# Patient Record
Sex: Female | Born: 1950
Health system: Southern US, Community
[De-identification: ages and names within clinical notes are randomized; demographics above are authoritative.]

## PROBLEM LIST (undated history)

## (undated) DIAGNOSIS — M8580 Other specified disorders of bone density and structure, unspecified site: Secondary | ICD-10-CM

## (undated) DIAGNOSIS — G4730 Sleep apnea, unspecified: Secondary | ICD-10-CM

## (undated) DIAGNOSIS — Z9221 Personal history of antineoplastic chemotherapy: Secondary | ICD-10-CM

## (undated) DIAGNOSIS — Z853 Personal history of malignant neoplasm of breast: Principal | ICD-10-CM

## (undated) DIAGNOSIS — Z923 Personal history of irradiation: Secondary | ICD-10-CM

## (undated) HISTORY — DX: Personal history of antineoplastic chemotherapy: Z92.21

## (undated) HISTORY — DX: Personal history of irradiation: Z92.3

## (undated) HISTORY — DX: Other specified disorders of bone density and structure, unspecified site: M85.80

## (undated) HISTORY — DX: Sleep apnea, unspecified: G47.30

## (undated) HISTORY — PX: COSMETIC SURGERY: SHX468

## (undated) HISTORY — DX: Personal history of malignant neoplasm of breast: Z85.3

---

## 1997-03-24 HISTORY — PX: ACHILLES TENDON REPAIR: SUR1153

## 1997-09-06 ENCOUNTER — Ambulatory Visit (HOSPITAL_COMMUNITY): Admission: RE | Admit: 1997-09-06 | Discharge: 1997-09-07 | Payer: Self-pay | Admitting: *Deleted

## 1997-09-23 ENCOUNTER — Emergency Department (HOSPITAL_COMMUNITY): Admission: EM | Admit: 1997-09-23 | Discharge: 1997-09-23 | Payer: Self-pay | Admitting: Emergency Medicine

## 1998-02-27 ENCOUNTER — Other Ambulatory Visit: Admission: RE | Admit: 1998-02-27 | Discharge: 1998-02-27 | Payer: Self-pay | Admitting: Obstetrics and Gynecology

## 1999-03-08 ENCOUNTER — Other Ambulatory Visit: Admission: RE | Admit: 1999-03-08 | Discharge: 1999-03-08 | Payer: Self-pay | Admitting: Obstetrics and Gynecology

## 2000-04-14 ENCOUNTER — Other Ambulatory Visit: Admission: RE | Admit: 2000-04-14 | Discharge: 2000-04-14 | Payer: Self-pay | Admitting: Obstetrics and Gynecology

## 2001-04-19 ENCOUNTER — Encounter: Admission: RE | Admit: 2001-04-19 | Discharge: 2001-04-19 | Payer: Self-pay | Admitting: Obstetrics and Gynecology

## 2001-04-19 ENCOUNTER — Other Ambulatory Visit: Admission: RE | Admit: 2001-04-19 | Discharge: 2001-04-19 | Payer: Self-pay | Admitting: Radiology

## 2001-04-19 ENCOUNTER — Encounter (INDEPENDENT_AMBULATORY_CARE_PROVIDER_SITE_OTHER): Payer: Self-pay | Admitting: *Deleted

## 2001-04-19 ENCOUNTER — Encounter: Payer: Self-pay | Admitting: Obstetrics and Gynecology

## 2001-04-19 HISTORY — PX: BREAST BIOPSY: SHX20

## 2001-04-23 ENCOUNTER — Encounter: Admission: RE | Admit: 2001-04-23 | Discharge: 2001-04-23 | Payer: Self-pay | Admitting: Surgery

## 2001-04-23 ENCOUNTER — Encounter: Payer: Self-pay | Admitting: Surgery

## 2001-04-26 ENCOUNTER — Ambulatory Visit: Admission: RE | Admit: 2001-04-26 | Discharge: 2001-07-25 | Payer: Self-pay | Admitting: Radiation Oncology

## 2001-04-27 ENCOUNTER — Ambulatory Visit (HOSPITAL_BASED_OUTPATIENT_CLINIC_OR_DEPARTMENT_OTHER): Admission: RE | Admit: 2001-04-27 | Discharge: 2001-04-27 | Payer: Self-pay | Admitting: Surgery

## 2001-04-27 ENCOUNTER — Encounter (INDEPENDENT_AMBULATORY_CARE_PROVIDER_SITE_OTHER): Payer: Self-pay | Admitting: Specialist

## 2001-04-27 HISTORY — PX: BREAST LUMPECTOMY: SHX2

## 2001-05-12 ENCOUNTER — Other Ambulatory Visit: Admission: RE | Admit: 2001-05-12 | Discharge: 2001-05-12 | Payer: Self-pay | Admitting: Obstetrics and Gynecology

## 2001-05-22 DIAGNOSIS — Z9221 Personal history of antineoplastic chemotherapy: Secondary | ICD-10-CM

## 2001-05-22 HISTORY — DX: Personal history of antineoplastic chemotherapy: Z92.21

## 2001-05-28 ENCOUNTER — Encounter: Payer: Self-pay | Admitting: Oncology

## 2001-05-28 ENCOUNTER — Ambulatory Visit (HOSPITAL_COMMUNITY): Admission: RE | Admit: 2001-05-28 | Discharge: 2001-05-28 | Payer: Self-pay | Admitting: Oncology

## 2001-07-22 DIAGNOSIS — Z923 Personal history of irradiation: Secondary | ICD-10-CM

## 2001-07-22 HISTORY — DX: Personal history of irradiation: Z92.3

## 2001-08-04 ENCOUNTER — Ambulatory Visit: Admission: RE | Admit: 2001-08-04 | Discharge: 2001-11-02 | Payer: Self-pay | Admitting: Radiation Oncology

## 2002-01-24 ENCOUNTER — Encounter: Admission: RE | Admit: 2002-01-24 | Discharge: 2002-01-24 | Payer: Self-pay | Admitting: Oncology

## 2002-01-24 ENCOUNTER — Encounter: Payer: Self-pay | Admitting: Oncology

## 2002-03-24 HISTORY — PX: DILATION AND CURETTAGE OF UTERUS: SHX78

## 2002-06-29 ENCOUNTER — Other Ambulatory Visit: Admission: RE | Admit: 2002-06-29 | Discharge: 2002-06-29 | Payer: Self-pay | Admitting: Obstetrics and Gynecology

## 2002-09-06 ENCOUNTER — Encounter: Payer: Self-pay | Admitting: Oncology

## 2002-09-06 ENCOUNTER — Encounter (HOSPITAL_COMMUNITY): Admission: RE | Admit: 2002-09-06 | Discharge: 2002-12-05 | Payer: Self-pay | Admitting: Oncology

## 2002-09-07 ENCOUNTER — Encounter: Payer: Self-pay | Admitting: Oncology

## 2002-11-10 ENCOUNTER — Ambulatory Visit (HOSPITAL_BASED_OUTPATIENT_CLINIC_OR_DEPARTMENT_OTHER): Admission: RE | Admit: 2002-11-10 | Discharge: 2002-11-10 | Payer: Self-pay | Admitting: Obstetrics and Gynecology

## 2002-11-10 ENCOUNTER — Encounter (INDEPENDENT_AMBULATORY_CARE_PROVIDER_SITE_OTHER): Payer: Self-pay | Admitting: *Deleted

## 2003-04-20 ENCOUNTER — Encounter: Admission: RE | Admit: 2003-04-20 | Discharge: 2003-04-20 | Payer: Self-pay | Admitting: Infectious Diseases

## 2003-04-25 ENCOUNTER — Encounter: Admission: RE | Admit: 2003-04-25 | Discharge: 2003-04-25 | Payer: Self-pay | Admitting: Infectious Diseases

## 2003-08-28 ENCOUNTER — Encounter: Admission: RE | Admit: 2003-08-28 | Discharge: 2003-08-28 | Payer: Self-pay | Admitting: Oncology

## 2004-04-19 ENCOUNTER — Ambulatory Visit: Payer: Self-pay | Admitting: Oncology

## 2004-09-20 ENCOUNTER — Encounter: Admission: RE | Admit: 2004-09-20 | Discharge: 2004-09-20 | Payer: Self-pay | Admitting: Oncology

## 2004-10-01 ENCOUNTER — Other Ambulatory Visit: Admission: RE | Admit: 2004-10-01 | Discharge: 2004-10-01 | Payer: Self-pay | Admitting: Obstetrics and Gynecology

## 2004-10-08 ENCOUNTER — Encounter: Admission: RE | Admit: 2004-10-08 | Discharge: 2004-10-08 | Payer: Self-pay | Admitting: Oncology

## 2004-11-11 ENCOUNTER — Ambulatory Visit: Payer: Self-pay | Admitting: Oncology

## 2005-05-12 ENCOUNTER — Ambulatory Visit: Payer: Self-pay | Admitting: Oncology

## 2005-07-04 ENCOUNTER — Ambulatory Visit: Payer: Self-pay | Admitting: Oncology

## 2005-08-20 ENCOUNTER — Ambulatory Visit: Payer: Self-pay | Admitting: Oncology

## 2005-10-13 ENCOUNTER — Encounter: Admission: RE | Admit: 2005-10-13 | Discharge: 2005-10-13 | Payer: Self-pay | Admitting: Obstetrics and Gynecology

## 2005-11-21 ENCOUNTER — Ambulatory Visit: Payer: Self-pay | Admitting: Oncology

## 2005-11-26 LAB — CBC WITH DIFFERENTIAL/PLATELET
BASO%: 0.3 % (ref 0.0–2.0)
EOS%: 2.7 % (ref 0.0–7.0)
LYMPH%: 43.8 % (ref 14.0–48.0)
MCH: 30.8 pg (ref 26.0–34.0)
MCHC: 34.2 g/dL (ref 32.0–36.0)
MONO#: 0.2 10*3/uL (ref 0.1–0.9)
MONO%: 6.6 % (ref 0.0–13.0)
Platelets: 189 10*3/uL (ref 145–400)
RBC: 4.58 10*6/uL (ref 3.70–5.32)
WBC: 3.5 10*3/uL — ABNORMAL LOW (ref 3.9–10.0)

## 2005-11-26 LAB — COMPREHENSIVE METABOLIC PANEL
ALT: 21 U/L (ref 0–40)
AST: 18 U/L (ref 0–37)
Alkaline Phosphatase: 35 U/L — ABNORMAL LOW (ref 39–117)
CO2: 25 mEq/L (ref 19–32)
Creatinine, Ser: 0.74 mg/dL (ref 0.40–1.20)
Sodium: 139 mEq/L (ref 135–145)
Total Bilirubin: 0.4 mg/dL (ref 0.3–1.2)
Total Protein: 6.8 g/dL (ref 6.0–8.3)

## 2005-12-24 ENCOUNTER — Encounter: Admission: RE | Admit: 2005-12-24 | Discharge: 2005-12-24 | Payer: Self-pay | Admitting: Oncology

## 2006-05-20 ENCOUNTER — Ambulatory Visit: Payer: Self-pay | Admitting: Oncology

## 2006-06-01 LAB — CBC WITH DIFFERENTIAL/PLATELET
BASO%: 0.6 % (ref 0.0–2.0)
Basophils Absolute: 0 10*3/uL (ref 0.0–0.1)
EOS%: 2.1 % (ref 0.0–7.0)
HCT: 41.4 % (ref 34.8–46.6)
HGB: 14.4 g/dL (ref 11.6–15.9)
LYMPH%: 49.1 % — ABNORMAL HIGH (ref 14.0–48.0)
MCH: 31 pg (ref 26.0–34.0)
MCHC: 34.8 g/dL (ref 32.0–36.0)
NEUT%: 41.5 % (ref 39.6–76.8)
Platelets: 182 10*3/uL (ref 145–400)

## 2006-06-01 LAB — COMPREHENSIVE METABOLIC PANEL
ALT: 14 U/L (ref 0–35)
BUN: 15 mg/dL (ref 6–23)
CO2: 25 mEq/L (ref 19–32)
Calcium: 9.4 mg/dL (ref 8.4–10.5)
Chloride: 104 mEq/L (ref 96–112)
Creatinine, Ser: 0.86 mg/dL (ref 0.40–1.20)

## 2006-06-01 LAB — CANCER ANTIGEN 27.29: CA 27.29: 9 U/mL (ref 0–39)

## 2006-12-07 ENCOUNTER — Encounter: Admission: RE | Admit: 2006-12-07 | Discharge: 2006-12-07 | Payer: Self-pay | Admitting: Oncology

## 2007-02-22 ENCOUNTER — Other Ambulatory Visit: Admission: RE | Admit: 2007-02-22 | Discharge: 2007-02-22 | Payer: Self-pay | Admitting: Obstetrics and Gynecology

## 2007-05-20 ENCOUNTER — Ambulatory Visit: Payer: Self-pay | Admitting: Oncology

## 2007-05-27 LAB — CBC WITH DIFFERENTIAL/PLATELET
BASO%: 1.4 % (ref 0.0–2.0)
Basophils Absolute: 0 10*3/uL (ref 0.0–0.1)
EOS%: 2.8 % (ref 0.0–7.0)
MCH: 31.7 pg (ref 26.0–34.0)
MCHC: 35.6 g/dL (ref 32.0–36.0)
MCV: 89.2 fL (ref 81.0–101.0)
MONO%: 7.2 % (ref 0.0–13.0)
RBC: 4.64 10*6/uL (ref 3.70–5.32)
RDW: 12.2 % (ref 11.3–14.5)

## 2007-05-27 LAB — CANCER ANTIGEN 27.29: CA 27.29: 10 U/mL (ref 0–39)

## 2007-05-27 LAB — COMPREHENSIVE METABOLIC PANEL
ALT: 20 U/L (ref 0–35)
AST: 18 U/L (ref 0–37)
CO2: 26 mEq/L (ref 19–32)
Chloride: 103 mEq/L (ref 96–112)
Sodium: 140 mEq/L (ref 135–145)
Total Bilirubin: 0.6 mg/dL (ref 0.3–1.2)
Total Protein: 7.2 g/dL (ref 6.0–8.3)

## 2007-07-26 ENCOUNTER — Ambulatory Visit: Payer: Self-pay | Admitting: Oncology

## 2007-12-21 ENCOUNTER — Encounter: Admission: RE | Admit: 2007-12-21 | Discharge: 2007-12-21 | Payer: Self-pay | Admitting: Oncology

## 2008-02-23 ENCOUNTER — Ambulatory Visit: Payer: Self-pay | Admitting: Cardiology

## 2008-05-03 ENCOUNTER — Encounter: Payer: Self-pay | Admitting: Cardiology

## 2008-05-03 ENCOUNTER — Ambulatory Visit: Payer: Self-pay | Admitting: Cardiology

## 2008-05-15 ENCOUNTER — Encounter: Payer: Self-pay | Admitting: Cardiology

## 2008-05-17 ENCOUNTER — Ambulatory Visit: Payer: Self-pay | Admitting: Cardiology

## 2008-05-17 ENCOUNTER — Ambulatory Visit: Payer: Self-pay

## 2008-05-26 ENCOUNTER — Ambulatory Visit: Payer: Self-pay | Admitting: Oncology

## 2008-05-30 LAB — CBC WITH DIFFERENTIAL/PLATELET
BASO%: 0.3 % (ref 0.0–2.0)
Basophils Absolute: 0 10*3/uL (ref 0.0–0.1)
EOS%: 2.4 % (ref 0.0–7.0)
MCH: 30.7 pg (ref 25.1–34.0)
MCHC: 34 g/dL (ref 31.5–36.0)
MONO#: 0.2 10*3/uL (ref 0.1–0.9)
NEUT#: 1.8 10*3/uL (ref 1.5–6.5)
Platelets: 157 10*3/uL (ref 145–400)
RDW: 13.4 % (ref 11.2–14.5)
lymph#: 1.5 10*3/uL (ref 0.9–3.3)

## 2008-05-30 LAB — COMPREHENSIVE METABOLIC PANEL
ALT: 15 U/L (ref 0–35)
AST: 15 U/L (ref 0–37)
Albumin: 4.4 g/dL (ref 3.5–5.2)
CO2: 26 mEq/L (ref 19–32)
Calcium: 9.3 mg/dL (ref 8.4–10.5)
Chloride: 104 mEq/L (ref 96–112)
Potassium: 4.3 mEq/L (ref 3.5–5.3)
Total Protein: 6.5 g/dL (ref 6.0–8.3)

## 2008-05-30 LAB — CANCER ANTIGEN 27.29: CA 27.29: 7 U/mL (ref 0–39)

## 2008-05-31 ENCOUNTER — Ambulatory Visit: Admission: RE | Admit: 2008-05-31 | Discharge: 2008-05-31 | Payer: Self-pay | Admitting: Emergency Medicine

## 2008-05-31 ENCOUNTER — Encounter: Payer: Self-pay | Admitting: Emergency Medicine

## 2008-08-01 ENCOUNTER — Ambulatory Visit: Payer: Self-pay | Admitting: Obstetrics and Gynecology

## 2008-08-01 ENCOUNTER — Other Ambulatory Visit: Admission: RE | Admit: 2008-08-01 | Discharge: 2008-08-01 | Payer: Self-pay | Admitting: Obstetrics and Gynecology

## 2008-08-01 ENCOUNTER — Encounter: Payer: Self-pay | Admitting: Obstetrics and Gynecology

## 2008-12-08 ENCOUNTER — Encounter: Admission: RE | Admit: 2008-12-08 | Discharge: 2008-12-08 | Payer: Self-pay | Admitting: Oncology

## 2008-12-27 ENCOUNTER — Encounter: Admission: RE | Admit: 2008-12-27 | Discharge: 2008-12-27 | Payer: Self-pay | Admitting: Oncology

## 2009-01-09 ENCOUNTER — Ambulatory Visit: Payer: Self-pay | Admitting: Oncology

## 2009-01-11 LAB — CBC WITH DIFFERENTIAL/PLATELET
EOS%: 1.9 % (ref 0.0–7.0)
Eosinophils Absolute: 0.1 10*3/uL (ref 0.0–0.5)
HCT: 41.3 % (ref 34.8–46.6)
HGB: 14.2 g/dL (ref 11.6–15.9)
MCH: 31 pg (ref 25.1–34.0)
MCHC: 34.3 g/dL (ref 31.5–36.0)
MCV: 90.3 fL (ref 79.5–101.0)
NEUT#: 3.3 10*3/uL (ref 1.5–6.5)
RBC: 4.58 10*6/uL (ref 3.70–5.45)
RDW: 13.6 % (ref 11.2–14.5)
WBC: 5.4 10*3/uL (ref 3.9–10.3)
lymph#: 1.6 10*3/uL (ref 0.9–3.3)

## 2009-01-11 LAB — COMPREHENSIVE METABOLIC PANEL
ALT: 16 U/L (ref 0–35)
Alkaline Phosphatase: 31 U/L — ABNORMAL LOW (ref 39–117)
Calcium: 8.9 mg/dL (ref 8.4–10.5)
Chloride: 105 mEq/L (ref 96–112)
Creatinine, Ser: 0.92 mg/dL (ref 0.40–1.20)
Total Bilirubin: 0.5 mg/dL (ref 0.3–1.2)
Total Protein: 6.6 g/dL (ref 6.0–8.3)

## 2009-02-08 ENCOUNTER — Ambulatory Visit: Payer: Self-pay | Admitting: Oncology

## 2010-01-04 ENCOUNTER — Encounter: Admission: RE | Admit: 2010-01-04 | Discharge: 2010-01-04 | Payer: Self-pay | Admitting: Oncology

## 2010-01-25 ENCOUNTER — Ambulatory Visit: Payer: Self-pay | Admitting: Oncology

## 2010-01-29 LAB — CBC WITH DIFFERENTIAL/PLATELET
EOS%: 2.2 % (ref 0.0–7.0)
LYMPH%: 29.8 % (ref 14.0–49.7)
MCH: 30.4 pg (ref 25.1–34.0)
MCHC: 34 g/dL (ref 31.5–36.0)
MCV: 89.3 fL (ref 79.5–101.0)
NEUT#: 3.1 10*3/uL (ref 1.5–6.5)
RDW: 13.6 % (ref 11.2–14.5)
WBC: 5.1 10*3/uL (ref 3.9–10.3)

## 2010-01-30 LAB — COMPREHENSIVE METABOLIC PANEL
Alkaline Phosphatase: 32 U/L — ABNORMAL LOW (ref 39–117)
CO2: 24 mEq/L (ref 19–32)
Calcium: 9 mg/dL (ref 8.4–10.5)
Creatinine, Ser: 0.88 mg/dL (ref 0.40–1.20)
Glucose, Bld: 102 mg/dL — ABNORMAL HIGH (ref 70–99)
Potassium: 4 mEq/L (ref 3.5–5.3)
Sodium: 140 mEq/L (ref 135–145)
Total Protein: 6.1 g/dL (ref 6.0–8.3)

## 2010-01-30 LAB — VITAMIN D 25 HYDROXY (VIT D DEFICIENCY, FRACTURES): Vit D, 25-Hydroxy: 57 ng/mL (ref 30–89)

## 2010-01-30 LAB — CANCER ANTIGEN 27.29: CA 27.29: 9 U/mL (ref 0–39)

## 2010-04-14 ENCOUNTER — Encounter: Payer: Self-pay | Admitting: Oncology

## 2010-04-15 ENCOUNTER — Ambulatory Visit (HOSPITAL_COMMUNITY): Admission: RE | Admit: 2010-04-15 | Payer: Self-pay | Source: Home / Self Care | Admitting: Oncology

## 2010-04-19 ENCOUNTER — Other Ambulatory Visit: Payer: Self-pay | Admitting: Oncology

## 2010-04-19 DIAGNOSIS — C50919 Malignant neoplasm of unspecified site of unspecified female breast: Secondary | ICD-10-CM

## 2010-04-19 DIAGNOSIS — Z9889 Other specified postprocedural states: Secondary | ICD-10-CM

## 2010-04-24 ENCOUNTER — Inpatient Hospital Stay (HOSPITAL_COMMUNITY): Admission: RE | Admit: 2010-04-24 | Payer: Self-pay | Source: Ambulatory Visit

## 2010-04-25 NOTE — Letter (Signed)
Summary: Telephone Triage Form  Telephone Triage Form   Imported By: Marylou Mccoy 02/13/2010 16:14:58  _____________________________________________________________________  External Attachment:    Type:   Image     Comment:   External Document

## 2010-04-29 ENCOUNTER — Other Ambulatory Visit (HOSPITAL_COMMUNITY): Payer: Self-pay

## 2010-04-29 ENCOUNTER — Ambulatory Visit (HOSPITAL_COMMUNITY)
Admission: RE | Admit: 2010-04-29 | Discharge: 2010-04-29 | Disposition: A | Discharge status: Home / Self Care | Payer: BC Managed Care – PPO | Source: Ambulatory Visit | Attending: Oncology | Admitting: Oncology

## 2010-04-29 DIAGNOSIS — C50919 Malignant neoplasm of unspecified site of unspecified female breast: Secondary | ICD-10-CM

## 2010-04-29 DIAGNOSIS — Z853 Personal history of malignant neoplasm of breast: Secondary | ICD-10-CM | POA: Insufficient documentation

## 2010-04-29 DIAGNOSIS — Z09 Encounter for follow-up examination after completed treatment for conditions other than malignant neoplasm: Secondary | ICD-10-CM | POA: Insufficient documentation

## 2010-04-29 DIAGNOSIS — Z9889 Other specified postprocedural states: Secondary | ICD-10-CM

## 2010-04-29 MED ORDER — GADOBENATE DIMEGLUMINE 529 MG/ML IV SOLN: 15.0000 mL | Freq: Once | INTRAVENOUS | Status: AC | PRN

## 2010-08-09 NOTE — Assessment & Plan Note (Signed)
[facility]                            CARDIOLOGY OFFICE NOTE   NAME:Heather Mercado, Heather Mercado                         MRN:          [id number]  DATE:03/03/2008                            DOB:          1950/09/23    REFERRING PHYSICIAN:  Lillia Carmel, MD, at [facility], [address] [place], [place] [address].   CHIEF COMPLAINT:  Chest discomfort.   HISTORY OF PRESENT ILLNESS:  Heather Mercado is an extraordinarily delightful 60-  year-old woman, who I know through various members of her family.  We  have been asked to see her with regard to a couple of specific  complaints.  First, she has noticed a fair amount of fatigue ever since  she had chemotherapy in 2003.  At that time, she had a lumpectomy, and  this was followed by 4 rounds of chemotherapy.  She has continued to  have a followup with Heather Mercado since that time.  She has been able to  maintain moderate activity, but since going through this endeavor, she  has not had the same energy level that she had had previously.  She has  also had some intermittent episodes of chest discomfort.  These have  occurred in the upper left parasternal region.  They are really  sporadic, do not last longer than a few minutes, and they occur on the  left side predominantly in the area near the first and second rib  insertion.  They are not related to exertion, and in fact she is able to  maintain a pretty regular exercise schedule without any of these.  When  she does get the discomfort, she feels fairly anxious.  There is no  associated nausea or diaphoresis.  It does not necessarily get worse  when she takes of breath in.  She has had a workup by Heather Mercado, and  this has revealed a mildly elevated LDL cholesterol, but her HDL really  is quite high, and her ratio was nearly normal.  With regard to risk  factors, she does not have significant diabetes or hypertension.  She  did smoke in college,  but has not smoked for nearly 25 years.  As noted,  she has not had any problems, whatsoever, with exertion producing the  symptoms.   DRUG ALLERGIES:  SULFA.  There is no allergy to shellfish or IVP or  latex.   MEDICATIONS:  1. Aromasin 25 mg daily.  2. Fish oil 1200 mg daily.  3. Caltrate 600 mg.  4. Glucosamine.  5. Chondroitin.  6. Multivitamin daily.   PAST MEDICAL HISTORY:  1. Lumpectomy in 2003 for breast cancer.  2. Achilles tendon repair by Heather Mercado in 1999.  3. C-section in 1986.   SOCIAL HISTORY:  The patient is married.  She does not smoke and drinks  alcohol only on a very rare basis.  She does exercise regularly, and  does play tennis.   Her father died in 01-08-2023 at the age of 49 and had stents.  He had an  intracranial bleed on Coumadin.  She  has a sister, who has manic  depressive disorder and a first cousin who has had CABG.  Her mother had  an aortic valve replacement.   REVIEW OF SYSTEMS:  The patient's weight has been stable.  She has been  cold intolerant.  She has not had significant shortness of breath.  There have been no specific GI or GU problems, specifically abdominal  pain, hematochezia, constipation, or diarrhea.  She sleeps well at  night.  She denies any significant depression or excess of stress.  The  remainder of the review of systems is unremarkable.   Other review of systems reveals that she had a MUGA scan prior to a  chemotherapy treatment for breast cancer previously.  I believe this  included Adriamycin.   PHYSICAL EXAMINATION:  GENERAL:  She is a very trim female.  VITAL SIGNS:  Her weight is 126.8 pounds; her blood pressure is 102/60,  equal in both arms; and the pulse is 70 and regular.  The height is 5  feet 5-1/2 inches.  NECK:  Supple.  HEENT:  Grossly unremarkable.  LUNGS:  The lung fields are clear to auscultation and percussion.  The  PMI is nondisplaced.  CARDIAC:  There is a normal first and second heart sound  without  murmurs, rubs, or gallops.  I did not appreciate a specific click.  No  S3 gallop is noted.  ABDOMEN:  Soft.  EXTREMITIES:  No edema.  NEUROLOGIC:  Grossly intact.  Specific to the chest, the point of  discomfort is clearly at the second intercostal space at the  sternocostal junction.  She is, however, not specifically tender to  palpation.   Electrocardiogram demonstrates normal sinus rhythm with sinus  arrhythmia.   Recent laboratory studies in Heather Mercado office reveals a sedimentation  rate of 2, uric acid is normal, and ANA is negative.  Thyroid functions  reveal a normal TSH of 2.497.  Hemoglobin is 14.9, hematocrit is 46.3,  and the platelet count is normal.  The white count is 3700.  BUN and  creatinine are normal.  HDL cholesterol is 88, triglycerides 74, an LDL  is 125.  Urinalysis is negative.  The vitamin D level is within normal  limits.   IMPRESSION:  1. Chest pain, atypical.  2. History of breast cancer status post chemotherapy.  3. History of prior Achilles tendon rupture with surgery.   RECOMMENDATIONS:  We had a thorough discussion today.  Her symptoms are  certainly atypical for a cardiac ischemic symptoms.  She has not been  having any exertion related to chest pain, and this is despite the fact  that she has an exercise routine that is regular, does Pilates 2 days  per week, and plays tennis.  There are no associated symptoms.  We  talked about the possibility of doing a standard routine exercise test.  Her pretest probability based on her symptomatology would be relatively  low.  Despite the benefit of adding radionuclide imaging, I would prefer  to avoid this particularly in light of her symptoms.  Based on our  discussions, she thought it would be reasonable to defer this type of  testing, as I told her I thought the likelihood that this represented  coronary artery disease was relatively low.  It would be worthwhile at  this point to obtain a  two-dimensional echocardiogram, specifically to  follow up on the fact that she has had prior chemotherapy.  There is no  evidence by examination that  she has a gallop or any evidence by either  history or physical of any significant LV dysfunction.  Nonetheless, a  baseline, and evaluation of her pericardium would be worthwhile as well.  I suspect that the symptoms are largely related to some costochondral  irritation, we talked about this in some detail today.  She was  reassured, and we will call her with the results of her echocardiogram.  If her symptoms persist, then an exercise echo would be a reasonable  option based upon her echo findings.     Arturo Morton. Riley Kill, MD, [facility]  Electronically Signed    TDS/MedQ  DD: 03/03/2008  DT: 03/03/2008  Job #: [id number]   cc:   Heather Mercado, M.D.

## 2010-08-09 NOTE — Op Note (Signed)
[facility]  Patient:    Heather Mercado, Heather Mercado Visit Number: [id number] MRN: [id number]          Service Type: DSU Location: [facility] Attending Physician:  Andre Lefort Dictated by:   Sandria Bales. Ezzard Standing, M.D. Proc. Date: 04/27/01 Admit Date:  04/27/2001   CC:         Titus Dubin. Alwyn Ren, M.D. [facility]  Dr. Jess Barters  Valentino Hue. Magrinat, M.D.  Billie Lade, M.D.  Cynthia P. Ashley Royalty, M.D.   Operative Report  DATE OF BIRTH:  [birth year].  PREOPERATIVE DIAGNOSIS:  Right breast cancer, approximately 11 oclock position.  POSTOPERATIVE DIAGNOSIS:  Right breast cancer, approximately 11 oclock position, with negative sentinel lymph node as read by Berneta Levins, M.D.  PROCEDURE:  Right partial mastectomy with sentinel lymph node biopsy and injection of isosulfan blue, lymph node #1 with counts of 700, background of 20, and blue; lymph node #2 with counts of 400 and background of 20, and mildly blue.  SURGEON:  Sandria Bales. Ezzard Standing, M.D.  FIRST ASSISTANT:  Cynthia P. Ashley Royalty, M.D.  ANESTHESIA:  General endotracheal.  ESTIMATED BLOOD LOSS:  30 cc.  DRAINS:  None.  INDICATION FOR PROCEDURE:  Ms. Almon is a 60 year old white female who had a palpable mass in the upper outer aspect of her right breast, which by core biopsy shows an invasive ductal carcinoma.  I discussed with the patient the options for treatment, including mastectomy both with and without reconstruction, and lumpectomy, and she wants to be considered for a lumpectomy.  She has already seen Dr. Antony Blackbird from radiation therapy in consultation.  The patient now comes partial mastectomy, sentinel lymph node biopsy, possible axillary dissection.  DESCRIPTION OF PROCEDURE:  She went through radiology first and had by Dr. Doloris Hall an injection of radioisotope sulfur colloid.  She presents to the operating room under general LMA anesthesia.  Her right breast was marked where I  thought I found a hot node and where the palpable tumor was, and I injected the subareolar space with isosulfan blue, approximately 1.5 cc.  The breast was then prepped with Betadine solution and sterilely draped.  I identified the sentinel lymph node in the axilla and was able to cut down directly on top of this.  I found two lymph nodes, the first with counts of 700 and clearly blue.  This was sent as a sentinel lymph node.  Then I found a second lymph node, which had counts of 400, with mildly blue.  Both had background counts of 20.  This was sent as a second node but not for touch prep.  Touch prep report by Dr. Star Age reported this as no obvious metastatic cancer.  I then turned my attention to the primary tumor.  I could palpate the tumor and tried to make sure I got at least a 2+ cm margin around this, doing essentially an upper outer quadrantectomy of her breast tissue.  I dissected this down to the chest wall.  I took out a block of breast tissue probably about 8-10 cm in diameter down to the pectoralis major muscle.  I thought I got well around the tumor.  The tumor was somewhat close to the deep margin.  I marked the medial part of the biopsy with a long suture, the cephalad portion of the biopsy with a short suture.  I also excised some skin with the specimen.  I then irrigated the wound and controlled  the bleeding with Bovie electrocautery and 3-0 Vicryl sutures.  I closed each skin wound with 3-0 Vicryl suture and the skin with 5-0 Monocryl suture, painted with tincture of benzoin and Steri-Strips and sterilely dressed.  The patient tolerated the procedure well, was transported to the recovery room in good condition.  Sponge and needle count were correct at the end of the case. Dictated by:   Sandria Bales. Ezzard Standing, M.D. Attending Physician:  Andre Lefort DD:  04/27/01 TD:  04/28/01 Job: [id number]

## 2010-08-09 NOTE — Op Note (Signed)
   NAME:  Heather Mercado, Heather Mercado                            ACCOUNT NO.:  [id number]   MEDICAL RECORD NO.:  [id number]                   PATIENT TYPE:  AMB   LOCATION:  [facility]                                 FACILITY:  [facility]   PHYSICIAN:  Cynthia P. Romine, M.D.             DATE OF BIRTH:  1950/07/28   DATE OF PROCEDURE:  11/10/2002  DATE OF DISCHARGE:                                 OPERATIVE REPORT   PREOPERATIVE DIAGNOSES:  1. Abnormal uterine bleeding while on tamoxifen.  2. Known endocervical polyp.   POSTOPERATIVE DIAGNOSES:  1. Abnormal uterine bleeding while on tamoxifen.  2. Known endocervical polyp.  3. Endometrial polyp, pathology pending.   OPERATION/PROCEDURE:  Hysteroscopy, dilatation and curettage, hysteroscopic  resection of polyp.   SURGEON:  Cynthia P. Romine, M.D.   ANESTHESIA:  General by LMA.   ESTIMATED BLOOD LOSS:  Minimal with a total deficit 80 mL.   COMPLICATIONS:  None.   DESCRIPTION OF PROCEDURE:  The patient was taken to the operating room and  after the induction of adequate general anesthesia by LMA was placed in the  dorsal lithotomy position and prepped and draped in the usual fashion.  The  bladder was drained with a red rubber catheter.  The cervix was grasped at  the anterior lip with a single-tooth tenaculum and the uterus was sounded to  7 cm.  The cervix was dilated to a #23 Shawnie Pons.  The diagnostic hysteroscope  was introduced.  The known endocervical polyp had retracted up the  endocervical canal and was no longer visible at the os as it had been in the  office, but it was in the endocervical canal.  In the endometrium, there was  a rather large polyp that extended off the left side of the fundus.  The  diagnostic scope was removed.  The cervix was dilated to #31 Shawnie Pons.  The  operative scope was introduced.  A single loop was used to resect the polyp.  The hysteroscope was removed. Sharp curettage was carried out and specimen  sent to pathology.   The hysteroscope was then reintroduced and several small  fragments of the polyp were again resected.  The endometrial cavity then  appeared clean as did the endocervix and the procedure was terminated.  The  patient tolerated it well went in satisfactory condition to post anesthesia  recovery.                                                 Cynthia P. Romine, M.D.    CPR/MEDQ  D:  11/10/2002  T:  11/10/2002  Job:  [id number]

## 2010-10-15 ENCOUNTER — Encounter: Payer: Self-pay | Admitting: *Deleted

## 2010-10-16 ENCOUNTER — Ambulatory Visit (INDEPENDENT_AMBULATORY_CARE_PROVIDER_SITE_OTHER): Payer: BC Managed Care – PPO | Admitting: Obstetrics and Gynecology

## 2010-10-16 ENCOUNTER — Other Ambulatory Visit (HOSPITAL_COMMUNITY)
Admission: RE | Admit: 2010-10-16 | Discharge: 2010-10-16 | Disposition: A | Discharge status: Home / Self Care | Payer: BC Managed Care – PPO | Source: Ambulatory Visit | Attending: Obstetrics and Gynecology | Admitting: Obstetrics and Gynecology

## 2010-10-16 ENCOUNTER — Other Ambulatory Visit: Payer: Self-pay | Admitting: Family Medicine

## 2010-10-16 ENCOUNTER — Encounter: Payer: Self-pay | Admitting: Obstetrics and Gynecology

## 2010-10-16 VITALS — BP 110/70 | Ht 66.0 in | Wt 132.0 lb

## 2010-10-16 DIAGNOSIS — N949 Unspecified condition associated with female genital organs and menstrual cycle: Secondary | ICD-10-CM

## 2010-10-16 DIAGNOSIS — Z124 Encounter for screening for malignant neoplasm of cervix: Secondary | ICD-10-CM

## 2010-10-16 DIAGNOSIS — Z01419 Encounter for gynecological examination (general) (routine) without abnormal findings: Secondary | ICD-10-CM

## 2010-10-16 DIAGNOSIS — N950 Postmenopausal bleeding: Secondary | ICD-10-CM

## 2010-10-16 DIAGNOSIS — R102 Pelvic and perineal pain: Secondary | ICD-10-CM

## 2010-10-16 DIAGNOSIS — Z1231 Encounter for screening mammogram for malignant neoplasm of breast: Secondary | ICD-10-CM

## 2010-10-16 NOTE — Progress Notes (Signed)
The patient came to see me today for 2 reasons. The first and most important is that over the past week she's had lower abdominal cramping which has persisted associated with vaginal spotting. She is no longer on tamoxifen. She does take Evista. When she was on tamoxifen she had postmenopausal bleeding as well. She underwent a D&C hysteroscopy by Dr. Tresa Res. Findings were endometrial polyps. She sees her primary care doctor and manages her lab. She is up-to-date on mammograms MRIs and bone densities. She does have low bone mass. She will get his copies of all the above. She is not dry at all with intercourse.  Past medical history, family history, social history, and review of system were all recorded in Epic and reviewed by me today.  Physical examination: HEENT within normal limits Neck fails to reveal masses No lymphadenopathy lymphadenopathy in supraclavicular area Breasts examined both sitting and lying. There are no dominant lesions. There is a divot in her right breast at site of previous lumpectomy.  Abdomen is soft without guarding rebound or masses. Pelvic: External within normal limits BUN is within normal limits vaginal examination within normal limits with excellent estrogen effect cervix is clean uterus is anteverted normal size and shape adnexa fails to reveal masses rectovaginal within normal limits  Assessment 1 postmenopausal bleeding 2 pelvic pain 3 breast cancer no existing disease  Plan [facility] scheduled, patient exam a mammogram records, patient to get me bone density records.

## 2010-10-21 ENCOUNTER — Ambulatory Visit (INDEPENDENT_AMBULATORY_CARE_PROVIDER_SITE_OTHER): Payer: BC Managed Care – PPO | Admitting: Obstetrics and Gynecology

## 2010-10-21 ENCOUNTER — Other Ambulatory Visit: Payer: BC Managed Care – PPO

## 2010-10-21 ENCOUNTER — Other Ambulatory Visit: Payer: Self-pay

## 2010-10-21 DIAGNOSIS — Z853 Personal history of malignant neoplasm of breast: Secondary | ICD-10-CM

## 2010-10-21 DIAGNOSIS — N83339 Acquired atrophy of ovary and fallopian tube, unspecified side: Secondary | ICD-10-CM

## 2010-10-21 DIAGNOSIS — Z1231 Encounter for screening mammogram for malignant neoplasm of breast: Secondary | ICD-10-CM

## 2010-10-21 DIAGNOSIS — N950 Postmenopausal bleeding: Secondary | ICD-10-CM

## 2010-10-21 NOTE — Progress Notes (Signed)
Heather Mercado  came back today because of postmenopausal bleeding.her uterus appears normal. Her endometrial echo is 4.3 mm. There is some fluid in the cavity. You can see some scarring where her previous endometrial polypectomy was done. Both her ovaries appear normal. Saline infusion histogram was done with injection and the endometrial cavity appeared normal. Endometrial biopsy was also done. Patient will call for her pathology report. She reported new postmenopausal bleeding to me.

## 2011-01-08 ENCOUNTER — Ambulatory Visit: Payer: BC Managed Care – PPO

## 2011-01-28 ENCOUNTER — Other Ambulatory Visit: Payer: Self-pay | Admitting: Oncology

## 2011-01-28 DIAGNOSIS — C50919 Malignant neoplasm of unspecified site of unspecified female breast: Secondary | ICD-10-CM

## 2011-01-28 DIAGNOSIS — Z1231 Encounter for screening mammogram for malignant neoplasm of breast: Secondary | ICD-10-CM

## 2011-01-30 ENCOUNTER — Other Ambulatory Visit (HOSPITAL_BASED_OUTPATIENT_CLINIC_OR_DEPARTMENT_OTHER): Payer: BC Managed Care – PPO | Admitting: Lab

## 2011-01-30 DIAGNOSIS — C50919 Malignant neoplasm of unspecified site of unspecified female breast: Secondary | ICD-10-CM

## 2011-01-30 DIAGNOSIS — Z853 Personal history of malignant neoplasm of breast: Secondary | ICD-10-CM

## 2011-01-30 LAB — CBC WITH DIFFERENTIAL/PLATELET
BASO%: 0.6 % (ref 0.0–2.0)
EOS%: 1.3 % (ref 0.0–7.0)
Eosinophils Absolute: 0 10*3/uL (ref 0.0–0.5)
MCV: 88.3 fL (ref 79.5–101.0)
MONO%: 8.8 % (ref 0.0–14.0)
NEUT#: 1.3 10*3/uL — ABNORMAL LOW (ref 1.5–6.5)
RBC: 4.45 10*6/uL (ref 3.70–5.45)
RDW: 13.2 % (ref 11.2–14.5)
WBC: 3.2 10*3/uL — ABNORMAL LOW (ref 3.9–10.3)

## 2011-01-31 LAB — COMPREHENSIVE METABOLIC PANEL
ALT: 15 U/L (ref 0–35)
AST: 20 U/L (ref 0–37)
CO2: 25 mEq/L (ref 19–32)
Sodium: 140 mEq/L (ref 135–145)
Total Bilirubin: 0.4 mg/dL (ref 0.3–1.2)
Total Protein: 6.1 g/dL (ref 6.0–8.3)

## 2011-01-31 LAB — CANCER ANTIGEN 27.29: CA 27.29: 10 U/mL (ref 0–39)

## 2011-02-05 ENCOUNTER — Ambulatory Visit: Payer: BC Managed Care – PPO | Admitting: Oncology

## 2011-02-05 ENCOUNTER — Other Ambulatory Visit: Payer: Self-pay | Admitting: *Deleted

## 2011-02-05 MED ORDER — RALOXIFENE HCL 60 MG PO TABS: 60.0000 mg | ORAL_TABLET | Freq: Every day | ORAL | Status: DC

## 2011-02-21 ENCOUNTER — Ambulatory Visit
Admission: RE | Admit: 2011-02-21 | Discharge: 2011-02-21 | Disposition: A | Discharge status: Home / Self Care | Payer: BC Managed Care – PPO | Source: Ambulatory Visit | Attending: Oncology | Admitting: Oncology

## 2011-02-21 DIAGNOSIS — Z1231 Encounter for screening mammogram for malignant neoplasm of breast: Secondary | ICD-10-CM

## 2011-03-03 ENCOUNTER — Ambulatory Visit (HOSPITAL_BASED_OUTPATIENT_CLINIC_OR_DEPARTMENT_OTHER): Payer: BC Managed Care – PPO | Admitting: Oncology

## 2011-03-03 ENCOUNTER — Telehealth: Payer: Self-pay | Admitting: *Deleted

## 2011-03-03 VITALS — BP 106/74 | HR 73 | Temp 97.9°F | Ht 66.0 in | Wt 132.2 lb

## 2011-03-03 DIAGNOSIS — C50419 Malignant neoplasm of upper-outer quadrant of unspecified female breast: Secondary | ICD-10-CM

## 2011-03-03 DIAGNOSIS — C50919 Malignant neoplasm of unspecified site of unspecified female breast: Secondary | ICD-10-CM

## 2011-03-03 NOTE — Telephone Encounter (Signed)
made patient appointment for 02-2012 printed out calendar and gave to the patient

## 2011-03-03 NOTE — Progress Notes (Signed)
ID: Heather Mercado   Interval History: Heather Mercado returns today for followup of her breast cancer. The interval history is unremarkable. Her youngest son will be graduating from high school and hopes to go to [employer] or one of several other schools in the [place]. The rest of the family is doing well and of course Heather Mercado continues to be extremely busy with [employer].  ROS:  A detailed review of systems was entirely negative. She exercises regularly. The only complaint she has is a little bit of tightness in the right upper extremity right shoulder girdle area. This is of course not new , it is secondary to her surgery, and it is minimal. We discussed how she can do stretching exercises regarding that. There is no evidence of lymphedema  Medications: I have reviewed the patient's current medications.  Current Outpatient Prescriptions  Medication Sig Dispense Refill  . fish oil-omega-3 fatty acids 1000 MG capsule Take 1 g by mouth daily.        Marland Kitchen glucosamine-chondroitin 500-400 MG tablet Take 1 tablet by mouth 3 (three) times daily.        . Multiple Vitamin (MULTIVITAMIN) capsule Take 1 capsule by mouth daily.        . raloxifene (EVISTA) 60 MG tablet Take 1 tablet (60 mg total) by mouth daily.  30 tablet  12  . Calcium Carbonate-Vit D-Min (CALTRATE PLUS PO) Take by mouth.        . Loratadine (CLARITIN PO) Take by mouth daily.           Objective:  Filed Vitals:   03/03/11 0933  BP: 106/74  Pulse: 73  Temp: 97.9 F (36.6 C)    Physical Exam:    Sclerae unicteric  Oropharynx clear  No peripheral adenopathy  Lungs clear -- no rales or rhonchi  Heart regular rate and rhythm  Abdomen benign  MSK no focal spinal tenderness, no peripheral edema  Neuro nonfocal  Breast exam: Right breast status post lumpectomy. There is some distortion of the breast profile but she is not interested in any kind of surgery for reconstruction. There is no evidence of disease recurrence. Left breast no  suspicious findings  Lab Results: CA 27-29 is normal at 10.  CMP    Chemistry      Component Value Date/Time   NA 140 01/30/2011 1423   K 3.6 01/30/2011 1423   CL 105 01/30/2011 1423   CO2 25 01/30/2011 1423   BUN 14 01/30/2011 1423   CREATININE 0.80 01/30/2011 1423      Component Value Date/Time   CALCIUM 8.7 01/30/2011 1423   ALKPHOS 29* 01/30/2011 1423   AST 20 01/30/2011 1423   ALT 15 01/30/2011 1423   BILITOT 0.4 01/30/2011 1423       CBC Lab Results  Component Value Date   WBC 3.2* 01/30/2011   HGB 13.4 01/30/2011   HCT 39.3 01/30/2011   MCV 88.3 01/30/2011   PLT 155 01/30/2011   NEUTROABS 1.3* 01/30/2011    Studies/Results:  Mm Digital Screening  02/24/2011  DG SCREEN MAMMOGRAM BILATERAL Bilateral CC and MLO view(s) were taken.  DIGITAL SCREENING MAMMOGRAM WITH CAD:  Comparison:  Prior studies.  The breast tissue is heterogeneously dense.  There is no dominant mass, architectural distortion or calcification to suggest malignancy.  Images were processed with CAD.  IMPRESSION:  No mammographic evidence of malignancy.  Suggest yearly screening mammography.  A result letter of this screening mammogram will be mailed directly  to the patient.   ASSESSMENT: Negative - BI-RADS 1  Screening mammogram in 1 year. ,    Assessment: 60 year old [place] woman status post right lumpectomy and sentinel lymph node biopsy February of 2003 for a T1c N1(mic), Stage IB invasive ductal carcinoma, status post cyclophosphamide and doxorubicin dose dense therapy x4 followed by radiation, then on tamoxifen for 1 year with poor tolerance, on exemestane between May of 2005 and May of 2010, at which point she switched to raloxifene.   Plan: She will see me again in one year. We are not going to do MRI of the breast or BSGI before that visit, but only her routine mammogram. I expect she will be discharged from followup at that point.  Heather Mercado C 03/03/2011

## 2011-12-25 ENCOUNTER — Encounter: Payer: Self-pay | Admitting: *Deleted

## 2011-12-25 ENCOUNTER — Other Ambulatory Visit: Payer: Self-pay | Admitting: *Deleted

## 2011-12-25 ENCOUNTER — Other Ambulatory Visit: Payer: Self-pay | Admitting: Oncology

## 2011-12-25 DIAGNOSIS — Z1231 Encounter for screening mammogram for malignant neoplasm of breast: Secondary | ICD-10-CM

## 2011-12-25 DIAGNOSIS — Z853 Personal history of malignant neoplasm of breast: Secondary | ICD-10-CM

## 2011-12-25 DIAGNOSIS — Z9889 Other specified postprocedural states: Secondary | ICD-10-CM

## 2011-12-25 DIAGNOSIS — M8580 Other specified disorders of bone density and structure, unspecified site: Secondary | ICD-10-CM | POA: Insufficient documentation

## 2011-12-25 HISTORY — DX: Personal history of malignant neoplasm of breast: Z85.3

## 2011-12-26 ENCOUNTER — Telehealth: Payer: Self-pay | Admitting: *Deleted

## 2011-12-26 ENCOUNTER — Other Ambulatory Visit: Payer: Self-pay | Admitting: *Deleted

## 2011-12-26 NOTE — Telephone Encounter (Signed)
Bone density scheduled same day mammogram at the breast center

## 2012-02-22 DIAGNOSIS — M8580 Other specified disorders of bone density and structure, unspecified site: Secondary | ICD-10-CM

## 2012-02-22 HISTORY — DX: Other specified disorders of bone density and structure, unspecified site: M85.80

## 2012-02-23 ENCOUNTER — Ambulatory Visit
Admission: RE | Admit: 2012-02-23 | Discharge: 2012-02-23 | Disposition: A | Discharge status: Home / Self Care | Payer: BC Managed Care – PPO | Source: Ambulatory Visit | Attending: Oncology | Admitting: Oncology

## 2012-02-23 DIAGNOSIS — Z853 Personal history of malignant neoplasm of breast: Secondary | ICD-10-CM

## 2012-02-23 DIAGNOSIS — Z1231 Encounter for screening mammogram for malignant neoplasm of breast: Secondary | ICD-10-CM

## 2012-02-23 DIAGNOSIS — Z9889 Other specified postprocedural states: Secondary | ICD-10-CM

## 2012-02-23 DIAGNOSIS — M8580 Other specified disorders of bone density and structure, unspecified site: Secondary | ICD-10-CM

## 2012-02-26 ENCOUNTER — Other Ambulatory Visit: Payer: Self-pay | Admitting: Oncology

## 2012-02-26 DIAGNOSIS — Z853 Personal history of malignant neoplasm of breast: Secondary | ICD-10-CM

## 2012-02-26 DIAGNOSIS — M899 Disorder of bone, unspecified: Secondary | ICD-10-CM

## 2012-03-02 ENCOUNTER — Other Ambulatory Visit (HOSPITAL_BASED_OUTPATIENT_CLINIC_OR_DEPARTMENT_OTHER): Payer: BC Managed Care – PPO | Admitting: Lab

## 2012-03-02 ENCOUNTER — Telehealth: Payer: Self-pay | Admitting: Oncology

## 2012-03-02 ENCOUNTER — Other Ambulatory Visit: Payer: Self-pay | Admitting: Emergency Medicine

## 2012-03-02 ENCOUNTER — Ambulatory Visit (HOSPITAL_BASED_OUTPATIENT_CLINIC_OR_DEPARTMENT_OTHER): Payer: BC Managed Care – PPO | Admitting: Oncology

## 2012-03-02 VITALS — BP 117/75 | HR 76 | Temp 98.6°F | Resp 20 | Ht 66.0 in | Wt 131.0 lb

## 2012-03-02 DIAGNOSIS — C50419 Malignant neoplasm of upper-outer quadrant of unspecified female breast: Secondary | ICD-10-CM

## 2012-03-02 DIAGNOSIS — E559 Vitamin D deficiency, unspecified: Secondary | ICD-10-CM

## 2012-03-02 DIAGNOSIS — Z853 Personal history of malignant neoplasm of breast: Secondary | ICD-10-CM

## 2012-03-02 DIAGNOSIS — Z170 Estrogen receptor positive status [ER+]: Secondary | ICD-10-CM

## 2012-03-02 DIAGNOSIS — M899 Disorder of bone, unspecified: Secondary | ICD-10-CM

## 2012-03-02 DIAGNOSIS — K1370 Unspecified lesions of oral mucosa: Secondary | ICD-10-CM

## 2012-03-02 DIAGNOSIS — M8580 Other specified disorders of bone density and structure, unspecified site: Secondary | ICD-10-CM

## 2012-03-02 LAB — COMPREHENSIVE METABOLIC PANEL (CC13)
Albumin: 3.5 g/dL (ref 3.5–5.0)
BUN: 19 mg/dL (ref 7.0–26.0)
CO2: 25 mEq/L (ref 22–29)
Calcium: 8.9 mg/dL (ref 8.4–10.4)
Chloride: 105 mEq/L (ref 98–107)
Creatinine: 0.8 mg/dL (ref 0.6–1.1)
Glucose: 91 mg/dl (ref 70–99)
Potassium: 4.3 mEq/L (ref 3.5–5.1)

## 2012-03-02 LAB — CBC WITH DIFFERENTIAL/PLATELET
Eosinophils Absolute: 0.1 10*3/uL (ref 0.0–0.5)
HCT: 42.1 % (ref 34.8–46.6)
LYMPH%: 38.8 % (ref 14.0–49.7)
MCV: 91 fL (ref 79.5–101.0)
MONO#: 0.3 10*3/uL (ref 0.1–0.9)
MONO%: 8 % (ref 0.0–14.0)
NEUT#: 1.8 10*3/uL (ref 1.5–6.5)
NEUT%: 50.7 % (ref 38.4–76.8)
Platelets: 166 10*3/uL (ref 145–400)
RBC: 4.63 10*6/uL (ref 3.70–5.45)
WBC: 3.5 10*3/uL — ABNORMAL LOW (ref 3.9–10.3)

## 2012-03-02 MED ORDER — RALOXIFENE HCL 60 MG PO TABS: 60.0000 mg | ORAL_TABLET | Freq: Every day | ORAL | Status: DC

## 2012-03-02 MED ORDER — VALACYCLOVIR HCL 500 MG PO TABS: 500.0000 mg | ORAL_TABLET | Freq: Two times a day (BID) | ORAL | Status: DC

## 2012-03-02 NOTE — Progress Notes (Signed)
ID: Heather Mercado   DOB: Jul 26, 1950  MR#: [id number]  CSN#:[id number]  PCP: No primary provider on file. GYN: Edyth Gunnels SU: Ovidio Kin MD OTHER MD: Chipper Herb MD, Vida Rigger MD  INTERVAL HISTORY: Heather Mercado returns today for followup of her breast cancer. Interval history is generally unremarkable. Family is doing well, she exercises 5 days a week, and she is tolerating the raloxifene without any side effects that she is aware of.  REVIEW OF SYSTEMS: She had an unusual viral syndrome with red I on the left and some mouth sores, which have largely resolved. She complains of hearing loss and feels a little bit more forgetful than previously. Otherwise a detailed review of systems today was entirely noncontributory.  PAST MEDICAL HISTORY: Past Medical History  Diagnosis Date  . Personal history of chemotherapy   . History of radiation therapy   . Breast cancer     right  . History of breast cancer 12/25/2011    PAST SURGICAL HISTORY: Past Surgical History  Procedure Date  . Breast lumpectomy     right  . Cesarean section 1986  . Breast lumpectomy 2003  . Achilles tendon repair 1999  . Dilation and curettage of uterus 2004    endometrial polyp  . Breast lumpectomy 2003    right    FAMILY HISTORY Family History  Problem Relation Age of Onset  . Breast cancer Mother   . Heart disease Father   . Colon cancer Maternal Aunt   . 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 Her husband Heather Mercado is in real estate. Son Heather Mercado works at [employer] in [place] environmental scientist. Heather Mercado of will be taking the [facility] [employer] [facility] [employer] bar exam February 2014, with a wedding shortly thereafter. Son Heather Mercado is it freshman at state.  ADVANCED DIRECTIVES:  HEALTH MAINTENANCE: History  Substance Use Topics  . Smoking status: Former Games developer  . Smokeless tobacco: Never Used  . Alcohol Use: 2.0 oz/week    4 drink(s) per week      Colonoscopy:  PAP:  Bone density:  Lipid panel:  No Known Allergies  Current Outpatient Prescriptions  Medication Sig Dispense Refill  . Calcium Carbonate-Vit D-Min (CALTRATE PLUS PO) Take by mouth.        . EVISTA 60 MG tablet TAKE ONE TABLET EVERY DAY  30 tablet  3  . fish oil-omega-3 fatty acids 1000 MG capsule Take 1 g by mouth daily.        Marland Kitchen glucosamine-chondroitin 500-400 MG tablet Take 1 tablet by mouth 3 (three) times daily.        . Loratadine (CLARITIN PO) Take by mouth daily.        . Multiple Vitamin (MULTIVITAMIN) capsule Take 1 capsule by mouth daily.          OBJECTIVE: Middle-aged white woman who appears well Filed Vitals:   03/02/12 1012  BP: 117/75  Pulse: 76  Temp: 98.6 F (37 C)  Resp: 20     Body mass index is 21.14 kg/(m^2).    ECOG FS: 0  Sclerae unicteric Oropharynx clear No cervical or supraclavicular adenopathy Lungs no rales or rhonchi Heart regular rate and rhythm Abd benign MSK no focal spinal tenderness, no peripheral edema Neuro: nonfocal Breasts: The right breast is status post surgery and radiation. There is no evidence of local recurrence. The right axilla is benign. The left breast is unremarkable.   LAB RESULTS: Lab Results  Component Value Date   WBC 3.5* 03/02/2012   NEUTROABS 1.8 03/02/2012   HGB 14.1 03/02/2012   HCT 42.1 03/02/2012   MCV 91.0 03/02/2012   PLT 166 03/02/2012      Chemistry      Component Value Date/Time   NA 140 01/30/2011 1423   K 3.6 01/30/2011 1423   CL 105 01/30/2011 1423   CO2 25 01/30/2011 1423   BUN 14 01/30/2011 1423   CREATININE 0.80 01/30/2011 1423      Component Value Date/Time   CALCIUM 8.7 01/30/2011 1423   ALKPHOS 29* 01/30/2011 1423   AST 20 01/30/2011 1423   ALT 15 01/30/2011 1423   BILITOT 0.4 01/30/2011 1423       Lab Results  Component Value Date   LABCA2 10 01/30/2011    No components found with this basename: [id number]    No results found for this basename:  INR:1;PROTIME:1 in the last 168 hours  Urinalysis No results found for this basename: colorurine, appearanceur, labspec, phurine, glucoseu, hgbur, bilirubinur, ketonesur, proteinur, urobilinogen, nitrite, leukocytesur    STUDIES: Dg Bone Density  02/23/2012  *RADIOLOGY REPORT*  Clinical Data: 61-year-old post menopausal Caucasian female.  The patient is on calcium and vitamin D replacement and has been on Evista for 3+ years.  DUAL X-RAY ABSORPTIOMETRY (DXA) FOR BONE MINERAL DENSITY  AP LUMBAR SPINE L3, L4.  Bone Mineral Density (BMD):            0.888 g/cm2 Young Adult T Score:                          -1.9 Z Score:                                                -0.4  LEFT FEMUR NECK  Bone Mineral Density (BMD):             0.793 g/cm2 Young Adult T Score:                           -0.5 Z Score:                                                 0.8  ASSESSMENT:  Patient's diagnostic category is LOW BONE MASS by WHO Criteria.  FRACTURE RISK: INCREASED  FRAX: World Health Organization FRAX assessment of absolute fracture risk is not calculated for this patient because the patient has a history of bone building therapy.  Comparison: Statistically significant decrease in lumbar spine bone mineral density when compared with baseline study ( 09/20/2004). However, no significant statistical change in the bone mineral density when compared with previous study (12/08/2008.  RECOMMENDATIONS:  Effective therapies are available in the form of bisphosphonates, selective estrogen receptor modulators, biologic agents, and hormone replacement therapy (for women).  All patients should ensure an adequate intake of dietary calcium (1200mg  daily) and vitamin D (800 IU daily) unless contraindicated.  All treatment decisions require clinical judgement and consideration of individual patient factors, including patient preferences, co-morbidities, previous drug use, risk factors not captured in the FRAX model (e.g., frailty, falls,  vitamin D deficiency, increased bone  turnover, interval significant decline in bone density) and possible under-or over-estimation of fracture risk by FRAX.  The National Osteoporosis Foundation recommends that FDA-approved medical therapies be considered in postmenopausal women and mean age 65 or older with a:        1)     Hip or vertebral (clinical or morphometric) fracture.           2)    T-score of -2.5 or lower at the spine or hip. 3)    Ten-year fracture probability by FRAX of 3% or greater for hip fracture or 20% or greater for major osteoporotic fracture. FOLLOW-UP:  People with diagnosed cases of osteoporosis or at high risk for fracture should have regular bone mineral density tests.  For patients eligible for Medicare, routine testing is allowed once every 2 years.  The testing frequency can be increased to one year for patients who have rapidly progressing disease, those who are receiving or discontinuing medical therapy to restore bone mass, or have additional risk factors.  World Science writer [facility]) Criteria:  Normal: T scores from +1.0 to -1.0 Low Bone Mass (Osteopenia): T scores between -1.0 and -2.5 Osteoporosis: T scores -2.5 and below  Comparison to Reference Population:  T score is the key measure used in the diagnosis of osteoporosis and relative risk determination for fracture.  It provides a value for bone mass relative to the mean bone mass of a young adult reference population expressed in terms of standard deviation (SD).  Z score is the age-matched score showing the patient's values compared to a population matched for age, sex, and race.  This is also expressed in terms of standard deviation.  The patient may have values that compare favorably to the age-matched values and still be at increased risk for fracture.   Original Report Authenticated By: Rolla Plate, M.D.    Mm Digital Screening  02/23/2012  *RADIOLOGY REPORT*  Clinical Data: Screening.  DIGITAL BILATERAL  SCREENING MAMMOGRAM WITH CAD DIGITAL BREAST TOMOSYNTHESIS  Digital breast tomosynthesis images are acquired in two projections.  These images are reviewed in combination with the digital mammogram, confirming the findings below.  Comparison:  Previous exams.  Findings:  There are scattered fibroglandular densities.  The patient has had a right lumpectomy.  No suspicious masses, architectural distortion, or calcifications are present.  Images were processed with CAD.  IMPRESSION: No mammographic evidence of malignancy.  A result letter of this screening mammogram will be mailed directly to the patient.  RECOMMENDATION: Screening mammogram in one year. (Code:[id number]-B-01Y)  BI-RADS CATEGORY 1:  Negative.   Original Report Authenticated By: Vincenza Hews, M.D.     ASSESSMENT: 61 y.o. status post right lumpectomy and sentinel lymph node biopsy February of 2003 for a T1c N1(mic), Stage IB invasive ductal carcinoma, status post cyclophosphamide and doxorubicin dose dense therapy x4 followed by radiation, then on tamoxifen for 1 year with poor tolerance, on exemestane between May of 2005 and May of 2010, at which point she switched to raloxifene.   PLAN: Camya is doing very well from a breast cancer point of view. I am not sure what kind of viral syndrome she had, but I wrote her a prescription for Valtrex at to take daily until her remaining mouth sore has cleared. She can resume this anytime of this problem recurs. I also wrote her a prescription for the zoster vaccine.  We discussed the possibility of "graduating" from followup, but after much discussion we decided to continue of visits on  a once a year basis, and she will see me again next December, shortly after her mammography. We are continuing the raloxifene indefinitely. She knows to call for any problems that may develop before the next visit.  Jakwon Gayton C    03/02/2012

## 2012-03-02 NOTE — Telephone Encounter (Signed)
gve the pt her dec 2014 appt calendar °

## 2012-03-03 LAB — VITAMIN D 25 HYDROXY (VIT D DEFICIENCY, FRACTURES): Vit D, 25-Hydroxy: 78 ng/mL (ref 30–89)

## 2012-03-12 ENCOUNTER — Encounter: Payer: Self-pay | Admitting: Oncology

## 2013-01-19 ENCOUNTER — Other Ambulatory Visit: Payer: Self-pay | Admitting: Oncology

## 2013-02-10 ENCOUNTER — Other Ambulatory Visit: Payer: Self-pay

## 2013-02-10 DIAGNOSIS — Z853 Personal history of malignant neoplasm of breast: Secondary | ICD-10-CM

## 2013-02-10 DIAGNOSIS — Z9889 Other specified postprocedural states: Secondary | ICD-10-CM

## 2013-02-10 DIAGNOSIS — Z1231 Encounter for screening mammogram for malignant neoplasm of breast: Secondary | ICD-10-CM

## 2013-03-02 ENCOUNTER — Other Ambulatory Visit: Payer: Self-pay | Admitting: Oncology

## 2013-03-02 NOTE — Telephone Encounter (Signed)
Patient called to r/s appt for 12/11 since Mammo will not be until after 12/23, will need refill on Evista before visit. Escribed to [facility].

## 2013-03-03 ENCOUNTER — Other Ambulatory Visit: Payer: Self-pay | Admitting: Oncology

## 2013-03-03 ENCOUNTER — Other Ambulatory Visit: Payer: BC Managed Care – PPO

## 2013-03-03 ENCOUNTER — Ambulatory Visit: Payer: BC Managed Care – PPO | Admitting: Oncology

## 2013-03-03 ENCOUNTER — Encounter: Payer: Self-pay | Admitting: Oncology

## 2013-03-07 ENCOUNTER — Telehealth: Payer: Self-pay | Admitting: *Deleted

## 2013-03-07 NOTE — Telephone Encounter (Signed)
Pt called for f/u . gv appt for 04/25/13 w/ labs@ 9 and ov@ 9:30am. Pt is aware...td

## 2013-03-15 ENCOUNTER — Ambulatory Visit
Admission: RE | Admit: 2013-03-15 | Discharge: 2013-03-15 | Disposition: A | Discharge status: Home / Self Care | Payer: BC Managed Care – PPO | Source: Ambulatory Visit

## 2013-03-15 DIAGNOSIS — Z853 Personal history of malignant neoplasm of breast: Secondary | ICD-10-CM

## 2013-03-15 DIAGNOSIS — Z1231 Encounter for screening mammogram for malignant neoplasm of breast: Secondary | ICD-10-CM

## 2013-03-15 DIAGNOSIS — Z9889 Other specified postprocedural states: Secondary | ICD-10-CM

## 2013-04-06 ENCOUNTER — Ambulatory Visit (INDEPENDENT_AMBULATORY_CARE_PROVIDER_SITE_OTHER): Payer: BC Managed Care – PPO | Admitting: Gynecology

## 2013-04-06 ENCOUNTER — Encounter: Payer: Self-pay | Admitting: Gynecology

## 2013-04-06 ENCOUNTER — Other Ambulatory Visit (HOSPITAL_COMMUNITY)
Admission: RE | Admit: 2013-04-06 | Discharge: 2013-04-06 | Disposition: A | Discharge status: Home / Self Care | Payer: BC Managed Care – PPO | Source: Ambulatory Visit | Attending: Gynecology | Admitting: Gynecology

## 2013-04-06 VITALS — BP 120/76 | Ht 65.0 in | Wt 124.0 lb

## 2013-04-06 DIAGNOSIS — R1032 Left lower quadrant pain: Secondary | ICD-10-CM

## 2013-04-06 DIAGNOSIS — Z01419 Encounter for gynecological examination (general) (routine) without abnormal findings: Secondary | ICD-10-CM

## 2013-04-06 DIAGNOSIS — C50919 Malignant neoplasm of unspecified site of unspecified female breast: Secondary | ICD-10-CM

## 2013-04-06 DIAGNOSIS — C50911 Malignant neoplasm of unspecified site of right female breast: Secondary | ICD-10-CM

## 2013-04-06 DIAGNOSIS — Z1151 Encounter for screening for human papillomavirus (HPV): Secondary | ICD-10-CM | POA: Insufficient documentation

## 2013-04-06 NOTE — Patient Instructions (Signed)
Followup for ultrasound as scheduled. Followup for annual exam in one year.

## 2013-04-06 NOTE — Progress Notes (Signed)
Heather Mercado 04-27-1950 [id number]        63 y.o.  G2P2 for annual exam.  Former patient of Dr. Cherylann Banas. Has not been in for 2 years. Several issues noted below.  Past medical history,surgical history, problem list, medications, allergies, family history and social history were all reviewed and documented in the EPIC chart.  ROS:  Performed and pertinent positives and negatives are included in the history, assessment and plan .  Exam: [name] assistant Filed Vitals:   04/06/13 1429  BP: 120/76  Height: 5\' 5"  (1.651 m)  Weight: 124 lb (56.246 kg)   General appearance  Normal Skin grossly normal Head/Neck normal with no cervical or supraclavicular adenopathy thyroid normal Lungs  clear Cardiac RR, without RMG Abdominal  soft, nontender, without masses, organomegaly or hernia Breasts  examined lying and sitting without masses, discharge or axillary adenopathy. Status post right lumpectomy with scarring and some old retraction. Pelvic  Ext/BUS/vagina  Normal with atrophic changes  Cervix  Normal with atrophic changes  Uterus  anteverted, normal size, shape and contour, midline and mobile nontender   Adnexa  Without masses or tenderness    Anus and perineum  Normal   Rectovaginal  Normal sphincter tone without palpated masses or tenderness.    Assessment/Plan:  63 y.o. G2P2 female for annual exam.   1. Postmenopausal with mild vaginal atrophic changes. Patient without significant symptoms of hot flushes, night sweats, vaginal dryness or dyspareunia. No history of vaginal bleeding. Will continue to monitor. Call if any vaginal bleeding. 2. Left lower quadrant discomfort. Patient notes nagging left lower quadrant discomfort over the last several months. She's pointing to the iliac crest region radiating to her back. No diarrhea constipation or urinary symptoms such as frequency dysuria or urgency. Had recent colonoscopy this month which was normal. Suspect orthopedic. Did recommend baseline  ultrasound rule out ovarian process although I think is high for this. I did discuss with her the issues of endometrial echo thickness and the possibility that can be a little thicker given she is on Evista. She's done no bleeding at all and we will accept a thicker endometrium. She has had a hysteroscopy D&C 2 years ago for some spotting which returned atrophic endometrium. 3. Osteopenia. DEXA 02/2012 with T score -1.9. She is on Evista. Recommend repeat DEXA next year to year interval.  4. History of right breast cancer status post lumpectomy, radiation and chemotherapy. Had been on tamoxifen and now Evista. Actively being followed by Dr. Jana Hakim. Has appointment to see him coming up. Mammography 02/2013 normal. SBE monthly reviewed. 5. Pap smear 2012. Pap/HPV done today. No history of significant abnormal Pap smears previously. 6. Colonoscopy 2015. Repeat at their recommended interval. 7. Health maintenance. No routine blood work done as this is done through her other physician's offices. Followup for ultrasound, otherwise annually.     Note: This document was prepared with digital dictation and possible smart phrase technology. Any transcriptional errors that result from this process are unintentional.   [name] MD, 3:58 PM 04/06/2013

## 2013-04-07 LAB — URINALYSIS W MICROSCOPIC + REFLEX CULTURE
BILIRUBIN URINE: NEGATIVE
Bacteria, UA: NONE SEEN
Casts: NONE SEEN
Crystals: NONE SEEN
GLUCOSE, UA: NEGATIVE mg/dL
HGB URINE DIPSTICK: NEGATIVE
Ketones, ur: NEGATIVE mg/dL
Leukocytes, UA: NEGATIVE
Nitrite: NEGATIVE
PROTEIN: NEGATIVE mg/dL
Specific Gravity, Urine: 1.013 (ref 1.005–1.030)
Squamous Epithelial / LPF: NONE SEEN
Urobilinogen, UA: 0.2 mg/dL (ref 0.0–1.0)
pH: 5 (ref 5.0–8.0)

## 2013-04-13 ENCOUNTER — Ambulatory Visit (INDEPENDENT_AMBULATORY_CARE_PROVIDER_SITE_OTHER): Payer: BC Managed Care – PPO | Admitting: Gynecology

## 2013-04-13 ENCOUNTER — Other Ambulatory Visit: Payer: Self-pay | Admitting: Gynecology

## 2013-04-13 ENCOUNTER — Encounter: Payer: Self-pay | Admitting: Gynecology

## 2013-04-13 ENCOUNTER — Ambulatory Visit (INDEPENDENT_AMBULATORY_CARE_PROVIDER_SITE_OTHER): Payer: BC Managed Care – PPO

## 2013-04-13 DIAGNOSIS — K5900 Constipation, unspecified: Secondary | ICD-10-CM

## 2013-04-13 DIAGNOSIS — N859 Noninflammatory disorder of uterus, unspecified: Secondary | ICD-10-CM

## 2013-04-13 DIAGNOSIS — R141 Gas pain: Secondary | ICD-10-CM

## 2013-04-13 DIAGNOSIS — N858 Other specified noninflammatory disorders of uterus: Secondary | ICD-10-CM

## 2013-04-13 DIAGNOSIS — R142 Eructation: Secondary | ICD-10-CM

## 2013-04-13 DIAGNOSIS — R1032 Left lower quadrant pain: Secondary | ICD-10-CM

## 2013-04-13 DIAGNOSIS — N83339 Acquired atrophy of ovary and fallopian tube, unspecified side: Secondary | ICD-10-CM

## 2013-04-13 DIAGNOSIS — R143 Flatulence: Secondary | ICD-10-CM

## 2013-04-13 DIAGNOSIS — R140 Abdominal distension (gaseous): Secondary | ICD-10-CM

## 2013-04-13 DIAGNOSIS — Z853 Personal history of malignant neoplasm of breast: Secondary | ICD-10-CM

## 2013-04-13 NOTE — Progress Notes (Signed)
Patient presents for ultrasound due to lower abdominal discomfort and bloating.  Ultrasound shows uterus normal size and echotexture. Endometrial echo 3.9 mm. Minimal cystic change is noted. Right left ovaries atrophic. Cul-de-sac negative. Excessive bile activity and stool shadowing noted.  Assessment and plan: Abdominal bloating secondary to bowel Recommend daily FiberCon or Metamucil to stimulate regular bowel movements and she admits to constipation. Ultrasound overall normal. With thin endometrium 3.9 mm and no history of bleeding on Evista for observation warranted at this point. Patient does report any vaginal bleeding. Followup routinely, sooner if any issues.

## 2013-04-13 NOTE — Patient Instructions (Signed)
Follow up for annual exam in one year, sooner if any issues. 

## 2013-04-25 ENCOUNTER — Other Ambulatory Visit (HOSPITAL_BASED_OUTPATIENT_CLINIC_OR_DEPARTMENT_OTHER): Payer: BC Managed Care – PPO

## 2013-04-25 ENCOUNTER — Ambulatory Visit (HOSPITAL_BASED_OUTPATIENT_CLINIC_OR_DEPARTMENT_OTHER): Payer: BC Managed Care – PPO | Admitting: Oncology

## 2013-04-25 ENCOUNTER — Telehealth: Payer: Self-pay | Admitting: *Deleted

## 2013-04-25 VITALS — BP 112/69 | HR 67 | Temp 98.8°F | Resp 18 | Ht 65.0 in | Wt 126.6 lb

## 2013-04-25 DIAGNOSIS — K5900 Constipation, unspecified: Secondary | ICD-10-CM

## 2013-04-25 DIAGNOSIS — C50919 Malignant neoplasm of unspecified site of unspecified female breast: Secondary | ICD-10-CM

## 2013-04-25 DIAGNOSIS — C50419 Malignant neoplasm of upper-outer quadrant of unspecified female breast: Secondary | ICD-10-CM

## 2013-04-25 DIAGNOSIS — Z170 Estrogen receptor positive status [ER+]: Secondary | ICD-10-CM

## 2013-04-25 LAB — CBC WITH DIFFERENTIAL/PLATELET
BASO%: 0.7 % (ref 0.0–2.0)
Basophils Absolute: 0 10*3/uL (ref 0.0–0.1)
EOS ABS: 0.1 10*3/uL (ref 0.0–0.5)
EOS%: 3.4 % (ref 0.0–7.0)
HCT: 41 % (ref 34.8–46.6)
HGB: 13.5 g/dL (ref 11.6–15.9)
LYMPH%: 42.7 % (ref 14.0–49.7)
MCH: 30.1 pg (ref 25.1–34.0)
MCHC: 32.9 g/dL (ref 31.5–36.0)
MCV: 91.3 fL (ref 79.5–101.0)
MONO#: 0.4 10*3/uL (ref 0.1–0.9)
MONO%: 8.7 % (ref 0.0–14.0)
NEUT%: 44.5 % (ref 38.4–76.8)
NEUTROS ABS: 1.9 10*3/uL (ref 1.5–6.5)
Platelets: 156 10*3/uL (ref 145–400)
RBC: 4.49 10*6/uL (ref 3.70–5.45)
RDW: 13.7 % (ref 11.2–14.5)
WBC: 4.3 10*3/uL (ref 3.9–10.3)
lymph#: 1.8 10*3/uL (ref 0.9–3.3)

## 2013-04-25 LAB — COMPREHENSIVE METABOLIC PANEL (CC13)
ALBUMIN: 3.6 g/dL (ref 3.5–5.0)
ALK PHOS: 30 U/L — AB (ref 40–150)
ALT: 24 U/L (ref 0–55)
AST: 22 U/L (ref 5–34)
Anion Gap: 8 mEq/L (ref 3–11)
BUN: 18.9 mg/dL (ref 7.0–26.0)
CO2: 26 mEq/L (ref 22–29)
Calcium: 9.1 mg/dL (ref 8.4–10.4)
Chloride: 107 mEq/L (ref 98–109)
Creatinine: 0.8 mg/dL (ref 0.6–1.1)
GLUCOSE: 95 mg/dL (ref 70–140)
POTASSIUM: 4.2 meq/L (ref 3.5–5.1)
SODIUM: 141 meq/L (ref 136–145)
TOTAL PROTEIN: 6 g/dL — AB (ref 6.4–8.3)
Total Bilirubin: 0.58 mg/dL (ref 0.20–1.20)

## 2013-04-25 NOTE — Progress Notes (Signed)
ID: Osker Mason   DOB: Jan 27, 1951  MR#: [id number]  CSN#:[id number]  PCP: No PCP Per Patient Robley Fries) GYN: Colin Broach SU: Ovidio Kin MD OTHER MD: Chipper Herb MD, Vida Rigger MD  INTERVAL HISTORY: Demetra returns today for followup of her breast cancer. Interval history is generally unremarkable. Family is doing well, with her second son married a year ago and working here in town, older son getting married later this year, youngest in [place] at school. Married exercises several times a week, and she is tolerating the raloxifene without any side effects that she is aware of.  REVIEW OF SYSTEMS: The only physical problems she is having is constipation. She was advised to take some fiber. She actually has a good diet and eats a lot of vegetables. She is drinking more water. The problem is generally infrequent but hard bowel movements. It can cause her pain in her lower left back particularly. She thinks she is having a little bit of memory loss. She feels mildly fatigued. Otherwise a detailed review of systems today was noncontributory  PAST MEDICAL HISTORY: Past Medical History  Diagnosis Date  . Personal history of chemotherapy   . History of radiation therapy   . History of breast cancer 12/25/2011    Right    PAST SURGICAL HISTORY: Past Surgical History  Procedure Laterality Date  . Breast lumpectomy      right  . Cesarean section  1986  . Breast lumpectomy  2003  . Achilles tendon repair  1999  . Dilation and curettage of uterus  2004    endometrial polyp  . Breast lumpectomy  2003    right    FAMILY HISTORY Family History  Problem Relation Age of Onset  . Breast cancer Mother 55  . Heart disease Father   . Colon cancer Maternal Aunt   . 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 Her husband Virl Diamond is in real estate. Son Fritzi Mandes works at [employer] as than Scientific laboratory technician. Sterling Big past  the [employer] [facility] bar exam February 2014, and got married shortly thereafter. Son Isaias Cowman is a sophomore at state.  ADVANCED DIRECTIVES: In place  HEALTH MAINTENANCE: History  Substance Use Topics  . Smoking status: Former Games developer  . Smokeless tobacco: Never Used  . Alcohol Use: 2.0 oz/week    4 drink(s) per week     Colonoscopy:  PAP:  Bone density:  Lipid panel:  Allergies  Allergen Reactions  . Sulfa Antibiotics Rash    Current Outpatient Prescriptions  Medication Sig Dispense Refill  . Calcium Carbonate-Vit D-Min (CALTRATE PLUS PO) Take by mouth.        . fish oil-omega-3 fatty acids 1000 MG capsule Take 1 g by mouth daily.        Marland Kitchen glucosamine-chondroitin 500-400 MG tablet Take 1 tablet by mouth 3 (three) times daily.        . Loratadine (CLARITIN PO) Take by mouth daily.        . Multiple Vitamin (MULTIVITAMIN) capsule Take 1 capsule by mouth daily.        . raloxifene (EVISTA) 60 MG tablet TAKE ONE TABLET EACH DAY  90 tablet  1   No current facility-administered medications for this visit.    OBJECTIVE: Middle-aged white woman who appears well Filed Vitals:   04/25/13 0936  BP: 112/69  Pulse: 67  Temp: 98.8 F (37.1 C)  Resp: 18  Body mass index is 21.07 kg/(m^2).    ECOG FS: 0  Sclerae unicteric, pupils equal and reactive Oropharynx clear and moist No cervical or supraclavicular adenopathy Lungs no rales or rhonchi Heart regular rate and rhythm Abd soft, nontender, positive bowel sounds MSK no focal spinal tenderness, no peripheral edema Neuro: nonfocal, well oriented, positive affect Breasts: The right breast is status post surgery and radiation. There are some telangiectasias secondary to the radiation. There is no evidence of local recurrence. The right axilla is benign. The left breast is unremarkable.   LAB RESULTS: Lab Results  Component Value Date   WBC 4.3 04/25/2013   NEUTROABS 1.9 04/25/2013   HGB 13.5 04/25/2013   HCT 41.0 04/25/2013   MCV 91.3  04/25/2013   PLT 156 04/25/2013      Chemistry      Component Value Date/Time   NA 137 03/02/2012 0957   NA 140 01/30/2011 1423   K 4.3 03/02/2012 0957   K 3.6 01/30/2011 1423   CL 105 03/02/2012 0957   CL 105 01/30/2011 1423   CO2 25 03/02/2012 0957   CO2 25 01/30/2011 1423   BUN 19.0 03/02/2012 0957   BUN 14 01/30/2011 1423   CREATININE 0.8 03/02/2012 0957   CREATININE 0.80 01/30/2011 1423      Component Value Date/Time   CALCIUM 8.9 03/02/2012 0957   CALCIUM 8.7 01/30/2011 1423   ALKPHOS 37* 03/02/2012 0957   ALKPHOS 29* 01/30/2011 1423   AST 20 03/02/2012 0957   AST 20 01/30/2011 1423   ALT 22 03/02/2012 0957   ALT 15 01/30/2011 1423   BILITOT 0.62 03/02/2012 0957   BILITOT 0.4 01/30/2011 1423       Lab Results  Component Value Date   LABCA2 12 03/02/2012    No components found with this basename: [id number]    No results found for this basename: INR,  in the last 168 hours  Urinalysis    Component Value Date/Time   COLORURINE YELLOW 04/06/2013 1555    STUDIES: Dg Bone Density  02/23/2012  *RADIOLOGY REPORT*  Clinical Data: 41-year-old post menopausal Caucasian female.  The patient is on calcium and vitamin D replacement and has been on Evista for 3+ years.  DUAL X-RAY ABSORPTIOMETRY (DXA) FOR BONE MINERAL DENSITY  AP LUMBAR SPINE L3, L4.  Bone Mineral Density (BMD):            0.888 g/cm2 Young Adult T Score:                          -1.9 Z Score:                                                -0.4  LEFT FEMUR NECK  Bone Mineral Density (BMD):             0.793 g/cm2 Young Adult T Score:                           -0.5 Z Score:  0.8  ASSESSMENT:  Patient's diagnostic category is LOW BONE MASS by WHO Criteria.  FRACTURE RISK: INCREASED  FRAX: World Health Organization FRAX assessment of absolute fracture risk is not calculated for this patient because the patient has a history of bone building therapy.  Comparison: Statistically  significant decrease in lumbar spine bone mineral density when compared with baseline study ( 09/20/2004). However, no significant statistical change in the bone mineral density when compared with previous study (12/08/2008.  RECOMMENDATIONS:  Effective therapies are available in the form of bisphosphonates, selective estrogen receptor modulators, biologic agents, and hormone replacement therapy (for women).  All patients should ensure an adequate intake of dietary calcium (1200mg  daily) and vitamin D (800 IU daily) unless contraindicated.  All treatment decisions require clinical judgement and consideration of individual patient factors, including patient preferences, co-morbidities, previous drug use, risk factors not captured in the FRAX model (e.g., frailty, falls, vitamin D deficiency, increased bone turnover, interval significant decline in bone density) and possible under-or over-estimation of fracture risk by FRAX.  The National Osteoporosis Foundation recommends that FDA-approved medical therapies be considered in postmenopausal women and mean age 98 or older with a:        1)     Hip or vertebral (clinical or morphometric) fracture.           2)    T-score of -2.5 or lower at the spine or hip. 3)    Ten-year fracture probability by FRAX of 3% or greater for hip fracture or 20% or greater for major osteoporotic fracture. FOLLOW-UP:  People with diagnosed cases of osteoporosis or at high risk for fracture should have regular bone mineral density tests.  For patients eligible for Medicare, routine testing is allowed once every 2 years.  The testing frequency can be increased to one year for patients who have rapidly progressing disease, those who are receiving or discontinuing medical therapy to restore bone mass, or have additional risk factors.  World Pharmacologist [facility]) Criteria:  Normal: T scores from +1.0 to -1.0 Low Bone Mass (Osteopenia): T scores between -1.0 and -2.5 Osteoporosis: T scores -2.5  and below  Comparison to Reference Population:  T score is the key measure used in the diagnosis of osteoporosis and relative risk determination for fracture.  It provides a value for bone mass relative to the mean bone mass of a young adult reference population expressed in terms of standard deviation (SD).  Z score is the age-matched score showing the patient's values compared to a population matched for age, sex, and race.  This is also expressed in terms of standard deviation.  The patient may have values that compare favorably to the age-matched values and still be at increased risk for fracture.   Original Report Authenticated By: Altamese Cabal, M.D.    Mammography December 2013 showed category C. density breast, with no evidence of recurrence  ASSESSMENT: 63 y.o. status post right lumpectomy and sentinel lymph node biopsy February of 2003 for a T1c N1(mic), Stage IB invasive ductal carcinoma, status post cyclophosphamide and doxorubicin dose dense therapy x4 followed by radiation, then on tamoxifen for 1 year with poor tolerance, on exemestane between May of 2005 and May of 2010, at which point she switched to raloxifene.   PLAN: Chistine is doing very well from a breast cancer point of view. She does not have a primary care physician and I will see if I can get her into Dr. Marcellus Scott' office. As far as the constipation  is concerned I suggested she start with stool softeners 2 tablets twice daily, and if that doesn't determine at [facility] daily. She will call if that does not work.  Otherwise the plan is to continue to raloxifene indefinitely and MAGRINAT,GUSTAV C    04/25/2013

## 2013-04-25 NOTE — Telephone Encounter (Signed)
appts made and printed. Pt requested that she complete her labs on the same day as her ov...td

## 2013-09-19 ENCOUNTER — Other Ambulatory Visit: Payer: Self-pay | Admitting: Oncology

## 2013-09-19 DIAGNOSIS — Z853 Personal history of malignant neoplasm of breast: Secondary | ICD-10-CM

## 2014-01-23 ENCOUNTER — Encounter: Payer: Self-pay | Admitting: Gynecology

## 2014-02-07 ENCOUNTER — Other Ambulatory Visit: Payer: Self-pay | Admitting: Dermatology

## 2014-02-14 ENCOUNTER — Other Ambulatory Visit: Payer: Self-pay

## 2014-02-14 DIAGNOSIS — Z1231 Encounter for screening mammogram for malignant neoplasm of breast: Secondary | ICD-10-CM

## 2014-02-28 ENCOUNTER — Other Ambulatory Visit: Payer: Self-pay | Admitting: Dermatology

## 2014-03-20 ENCOUNTER — Telehealth: Payer: Self-pay | Admitting: Oncology

## 2014-03-20 NOTE — Telephone Encounter (Signed)
pt wanted to r/s appt-gave pt copy of sch updated

## 2014-03-21 ENCOUNTER — Ambulatory Visit: Payer: BC Managed Care – PPO

## 2014-04-04 ENCOUNTER — Ambulatory Visit: Payer: BC Managed Care – PPO

## 2014-04-12 ENCOUNTER — Ambulatory Visit
Admission: RE | Admit: 2014-04-12 | Discharge: 2014-04-12 | Disposition: A | Discharge status: Home / Self Care | Payer: BLUE CROSS/BLUE SHIELD | Source: Ambulatory Visit

## 2014-04-12 ENCOUNTER — Ambulatory Visit: Payer: Self-pay

## 2014-04-12 DIAGNOSIS — Z1231 Encounter for screening mammogram for malignant neoplasm of breast: Secondary | ICD-10-CM

## 2014-04-17 ENCOUNTER — Encounter: Payer: BC Managed Care – PPO | Admitting: Gynecology

## 2014-04-25 ENCOUNTER — Other Ambulatory Visit: Payer: BC Managed Care – PPO

## 2014-04-25 ENCOUNTER — Ambulatory Visit: Payer: BC Managed Care – PPO | Admitting: Oncology

## 2014-05-01 ENCOUNTER — Ambulatory Visit (INDEPENDENT_AMBULATORY_CARE_PROVIDER_SITE_OTHER): Payer: BLUE CROSS/BLUE SHIELD | Admitting: Gynecology

## 2014-05-01 ENCOUNTER — Ambulatory Visit (INDEPENDENT_AMBULATORY_CARE_PROVIDER_SITE_OTHER): Payer: BLUE CROSS/BLUE SHIELD | Admitting: Pulmonary Disease

## 2014-05-01 ENCOUNTER — Encounter: Payer: Self-pay | Admitting: Pulmonary Disease

## 2014-05-01 ENCOUNTER — Encounter: Payer: Self-pay | Admitting: Gynecology

## 2014-05-01 VITALS — BP 120/76 | Ht 65.0 in | Wt 127.0 lb

## 2014-05-01 VITALS — BP 110/66 | HR 63 | Temp 97.0°F | Ht 66.0 in | Wt 127.2 lb

## 2014-05-01 DIAGNOSIS — G4733 Obstructive sleep apnea (adult) (pediatric): Secondary | ICD-10-CM

## 2014-05-01 DIAGNOSIS — Z01419 Encounter for gynecological examination (general) (routine) without abnormal findings: Secondary | ICD-10-CM

## 2014-05-01 LAB — CBC WITH DIFFERENTIAL/PLATELET
BASOS ABS: 0 10*3/uL (ref 0.0–0.1)
Basophils Relative: 1 % (ref 0–1)
EOS ABS: 0.1 10*3/uL (ref 0.0–0.7)
Eosinophils Relative: 2 % (ref 0–5)
HEMATOCRIT: 41 % (ref 36.0–46.0)
HEMOGLOBIN: 13.7 g/dL (ref 12.0–15.0)
Lymphocytes Relative: 40 % (ref 12–46)
Lymphs Abs: 1.5 10*3/uL (ref 0.7–4.0)
MCH: 29.4 pg (ref 26.0–34.0)
MCHC: 33.4 g/dL (ref 30.0–36.0)
MCV: 88 fL (ref 78.0–100.0)
MONO ABS: 0.3 10*3/uL (ref 0.1–1.0)
MONOS PCT: 8 % (ref 3–12)
MPV: 9.8 fL (ref 8.6–12.4)
Neutro Abs: 1.9 10*3/uL (ref 1.7–7.7)
Neutrophils Relative %: 49 % (ref 43–77)
PLATELETS: 168 10*3/uL (ref 150–400)
RBC: 4.66 MIL/uL (ref 3.87–5.11)
RDW: 14.5 % (ref 11.5–15.5)
WBC: 3.8 10*3/uL — AB (ref 4.0–10.5)

## 2014-05-01 LAB — TSH: TSH: 3.687 u[IU]/mL (ref 0.350–4.500)

## 2014-05-01 LAB — COMPREHENSIVE METABOLIC PANEL
ALK PHOS: 33 U/L — AB (ref 39–117)
ALT: 21 U/L (ref 0–35)
AST: 21 U/L (ref 0–37)
Albumin: 4.2 g/dL (ref 3.5–5.2)
BUN: 19 mg/dL (ref 6–23)
CO2: 27 mEq/L (ref 19–32)
CREATININE: 0.71 mg/dL (ref 0.50–1.10)
Calcium: 9.1 mg/dL (ref 8.4–10.5)
Chloride: 104 mEq/L (ref 96–112)
GLUCOSE: 84 mg/dL (ref 70–99)
Potassium: 4 mEq/L (ref 3.5–5.3)
Sodium: 140 mEq/L (ref 135–145)
TOTAL PROTEIN: 6.3 g/dL (ref 6.0–8.3)
Total Bilirubin: 0.6 mg/dL (ref 0.2–1.2)

## 2014-05-01 LAB — LIPID PANEL
CHOL/HDL RATIO: 2.2 ratio
Cholesterol: 227 mg/dL — ABNORMAL HIGH (ref 0–200)
HDL: 105 mg/dL (ref >39)
LDL Cholesterol: 111 mg/dL — ABNORMAL HIGH (ref 0–99)
TRIGLYCERIDES: 54 mg/dL (ref <150)
VLDL: 11 mg/dL (ref 0–40)

## 2014-05-01 NOTE — Patient Instructions (Signed)
You may obtain a copy of any labs that were done today by logging onto MyChart as outlined in the instructions provided with your AVS (after visit summary). The office will not call with normal lab results but certainly if there are any significant abnormalities then we will contact you.   Health Maintenance, Female A healthy lifestyle and preventative care can promote health and wellness.  Maintain regular health, dental, and eye exams.  Eat a healthy diet. Foods like vegetables, fruits, whole grains, low-fat dairy products, and lean protein foods contain the nutrients you need without too many calories. Decrease your intake of foods high in solid fats, added sugars, and salt. Get information about a proper diet from your caregiver, if necessary.  Regular physical exercise is one of the most important things you can do for your health. Most adults should get at least 150 minutes of moderate-intensity exercise (any activity that increases your heart rate and causes you to sweat) each week. In addition, most adults need muscle-strengthening exercises on 2 or more days a week.   Maintain a healthy weight. The body mass index (BMI) is a screening tool to identify possible weight problems. It provides an estimate of body fat based on height and weight. Your caregiver can help determine your BMI, and can help you achieve or maintain a healthy weight. For adults 20 years and older:  A BMI below 18.5 is considered underweight.  A BMI of 18.5 to 24.9 is normal.  A BMI of 25 to 29.9 is considered overweight.  A BMI of 30 and above is considered obese.  Maintain normal blood lipids and cholesterol by exercising and minimizing your intake of saturated fat. Eat a balanced diet with plenty of fruits and vegetables. Blood tests for lipids and cholesterol should begin at age 61 and be repeated every 5 years. If your lipid or cholesterol levels are high, you are over 50, or you are a high risk for heart  disease, you may need your cholesterol levels checked more frequently.Ongoing high lipid and cholesterol levels should be treated with medicines if diet and exercise are not effective.  If you smoke, find out from your caregiver how to quit. If you do not use tobacco, do not start.  Lung cancer screening is recommended for adults aged 33 80 years who are at high risk for developing lung cancer because of a history of smoking. Yearly low-dose computed tomography (CT) is recommended for people who have at least a 30-pack-year history of smoking and are a current smoker or have quit within the past 15 years. A pack year of smoking is smoking an average of 1 pack of cigarettes a day for 1 year (for example: 1 pack a day for 30 years or 2 packs a day for 15 years). Yearly screening should continue until the smoker has stopped smoking for at least 15 years. Yearly screening should also be stopped for people who develop a health problem that would prevent them from having lung cancer treatment.  If you are pregnant, do not drink alcohol. If you are breastfeeding, be very cautious about drinking alcohol. If you are not pregnant and choose to drink alcohol, do not exceed 1 drink per day. One drink is considered to be 12 ounces (355 mL) of beer, 5 ounces (148 mL) of wine, or 1.5 ounces (44 mL) of liquor.  Avoid use of street drugs. Do not share needles with anyone. Ask for help if you need support or instructions about stopping  the use of drugs.  High blood pressure causes heart disease and increases the risk of stroke. Blood pressure should be checked at least every 1 to 2 years. Ongoing high blood pressure should be treated with medicines, if weight loss and exercise are not effective.  If you are 59 to 64 years old, ask your caregiver if you should take aspirin to prevent strokes.  Diabetes screening involves taking a blood sample to check your fasting blood sugar level. This should be done once every 3  years, after age 91, if you are within normal weight and without risk factors for diabetes. Testing should be considered at a younger age or be carried out more frequently if you are overweight and have at least 1 risk factor for diabetes.  Breast cancer screening is essential preventative care for women. You should practice "breast self-awareness." This means understanding the normal appearance and feel of your breasts and may include breast self-examination. Any changes detected, no matter how small, should be reported to a caregiver. Women in their 66s and 30s should have a clinical breast exam (CBE) by a caregiver as part of a regular health exam every 1 to 3 years. After age 101, women should have a CBE every year. Starting at age 100, women should consider having a mammogram (breast X-ray) every year. Women who have a family history of breast cancer should talk to their caregiver about genetic screening. Women at a high risk of breast cancer should talk to their caregiver about having an MRI and a mammogram every year.  Breast cancer gene (BRCA)-related cancer risk assessment is recommended for women who have family members with BRCA-related cancers. BRCA-related cancers include breast, ovarian, tubal, and peritoneal cancers. Having family members with these cancers may be associated with an increased risk for harmful changes (mutations) in the breast cancer genes BRCA1 and BRCA2. Results of the assessment will determine the need for genetic counseling and BRCA1 and BRCA2 testing.  The Pap test is a screening test for cervical cancer. Women should have a Pap test starting at age 57. Between ages 25 and 35, Pap tests should be repeated every 2 years. Beginning at age 37, you should have a Pap test every 3 years as long as the past 3 Pap tests have been normal. If you had a hysterectomy for a problem that was not cancer or a condition that could lead to cancer, then you no longer need Pap tests. If you are  between ages 50 and 76, and you have had normal Pap tests going back 10 years, you no longer need Pap tests. If you have had past treatment for cervical cancer or a condition that could lead to cancer, you need Pap tests and screening for cancer for at least 20 years after your treatment. If Pap tests have been discontinued, risk factors (such as a new sexual partner) need to be reassessed to determine if screening should be resumed. Some women have medical problems that increase the chance of getting cervical cancer. In these cases, your caregiver may recommend more frequent screening and Pap tests.  The human papillomavirus (HPV) test is an additional test that may be used for cervical cancer screening. The HPV test looks for the virus that can cause the cell changes on the cervix. The cells collected during the Pap test can be tested for HPV. The HPV test could be used to screen women aged 44 years and older, and should be used in women of any age  who have unclear Pap test results. After the age of 55, women should have HPV testing at the same frequency as a Pap test.  Colorectal cancer can be detected and often prevented. Most routine colorectal cancer screening begins at the age of 44 and continues through age 20. However, your caregiver may recommend screening at an earlier age if you have risk factors for colon cancer. On a yearly basis, your caregiver may provide home test kits to check for hidden blood in the stool. Use of a small camera at the end of a tube, to directly examine the colon (sigmoidoscopy or colonoscopy), can detect the earliest forms of colorectal cancer. Talk to your caregiver about this at age 86, when routine screening begins. Direct examination of the colon should be repeated every 5 to 10 years through age 13, unless early forms of pre-cancerous polyps or small growths are found.  Hepatitis C blood testing is recommended for all people born from [birth year] through 1964 and any  individual with known risks for hepatitis C.  Practice safe sex. Use condoms and avoid high-risk sexual practices to reduce the spread of sexually transmitted infections (STIs). Sexually active women aged 36 and younger should be checked for Chlamydia, which is a common sexually transmitted infection. Older women with new or multiple partners should also be tested for Chlamydia. Testing for other STIs is recommended if you are sexually active and at increased risk.  Osteoporosis is a disease in which the bones lose minerals and strength with aging. This can result in serious bone fractures. The risk of osteoporosis can be identified using a bone density scan. Women ages 20 and over and women at risk for fractures or osteoporosis should discuss screening with their caregivers. Ask your caregiver whether you should be taking a calcium supplement or vitamin D to reduce the rate of osteoporosis.  Menopause can be associated with physical symptoms and risks. Hormone replacement therapy is available to decrease symptoms and risks. You should talk to your caregiver about whether hormone replacement therapy is right for you.  Use sunscreen. Apply sunscreen liberally and repeatedly throughout the day. You should seek shade when your shadow is shorter than you. Protect yourself by wearing long sleeves, pants, a wide-brimmed hat, and sunglasses year round, whenever you are outdoors.  Notify your caregiver of new moles or changes in moles, especially if there is a change in shape or color. Also notify your caregiver if a mole is larger than the size of a pencil eraser.  Stay current with your immunizations. Document Released: 09/23/2010 Document Revised: 07/05/2012 Document Reviewed: 09/23/2010 [facility] Patient Information 2014 [employer].

## 2014-05-01 NOTE — Progress Notes (Signed)
Subjective:    Patient ID: Heather Mercado, female    DOB: May 02, 1950, 64 y.o.   MRN: [id number]  HPI The patient is a 64 year old female who comes in today for evaluation of possible obstructive sleep apnea. She has been noted to have loud snoring and an abnormal breathing pattern during sleep, but she denies any choking arousals. She awakens about 2-3 times a night, and does not feel rested in the mornings upon arising. She does note some sleep pressure during the day with periods of inactivity, but it does not overly impact her quality of life. However, she can fall asleep easily in the evenings watching movies or television. She can also have some sleep pressure when driving longer distances. The patient states that her weight is essentially been fairly stable over the last 2 years, and her Epworth score today is only 4.   Sleep Questionnaire What time do you typically go to bed?( Between what hours) 11p-12a 11p-12a at 1416 on 05/01/14 by Inge Rise, CMA How long does it take you to fall asleep? 15 min 15 min at 1416 on 05/01/14 by Inge Rise, CMA How many times during the night do you wake up? 2 2 at 1416 on 05/01/14 by Inge Rise, [place] What time do you get out of bed to start your day? 0700 0700 at 1416 on 05/01/14 by Inge Rise, CMA Do you drive or operate heavy machinery in your occupation? No No at 1416 on 05/01/14 by Inge Rise, CMA How much has your weight changed (up or down) over the past two years? (In pounds) 2 lb (0.907 kg) 2 lb (0.907 kg) at 1416 on 05/01/14 by Inge Rise, CMA Have you ever had a sleep study before? No No at 1416 on 05/01/14 by Inge Rise, CMA Do you currently use CPAP? No No at 1416 on 05/01/14 by Inge Rise, CMA Do you wear oxygen at any time? No No at 1416 on 05/01/14 by Inge Rise, CMA   Review of Systems  Constitutional: Negative for fever and unexpected weight change.  HENT: Positive for congestion. Negative for  dental problem, ear pain, nosebleeds, postnasal drip, rhinorrhea, sinus pressure, sneezing, sore throat and trouble swallowing.   Eyes: Negative for redness and itching.  Respiratory: Negative for cough, chest tightness, shortness of breath and wheezing.   Cardiovascular: Negative for palpitations and leg swelling.  Gastrointestinal: Negative for nausea and vomiting.  Genitourinary: Negative for dysuria.  Musculoskeletal: Positive for arthralgias. Negative for joint swelling.  Skin: Negative for rash.  Neurological: Negative for headaches.  Hematological: Does not bruise/bleed easily.  Psychiatric/Behavioral: Negative for dysphoric mood. The patient is not nervous/anxious.        Objective:   Physical Exam Constitutional:  Thin female, no acute distress  HENT:  Nares patent without discharge, but very narrow bilat  Oropharynx without exudate, palate and uvula are mildly elongated.   Eyes:  Perrla, eomi, no scleral icterus  Neck:  No JVD, no TMG  Cardiovascular:  Normal rate, regular rhythm, no rubs or gallops.  No murmurs        Intact distal pulses  Pulmonary :  Normal breath sounds, no stridor or respiratory distress   No rales, rhonchi, or wheezing  Abdominal:  Soft, nondistended, bowel sounds present.  No tenderness noted.   Musculoskeletal:  No lower extremity edema noted.  Lymph Nodes:  No cervical lymphadenopathy noted  Skin:  No cyanosis noted  Neurologic:  Alert, appropriate, moves all 4 extremities without obvious deficit.         Assessment & Plan:

## 2014-05-01 NOTE — Assessment & Plan Note (Signed)
The patient has been noted to have snoring and an abnormal breathing pattern during sleep, as well as nonrestorative sleep. She also notes some sleepiness during the day and evening with inactivity. It is unclear whether she has clinically significant sleep apnea or not, but her history is very suspicious. She does not have the normal body habitus for someone with sleep apnea, however I have explained to her that I have many patients who are thin and have significant sleep disordered breathing. At this point, I think she does need a sleep study to put the issue to rest, and I think she is an excellent candidate for home sleep testing.

## 2014-05-01 NOTE — Progress Notes (Signed)
Heather Mercado 1950/06/01 [id number]        64 y.o.  G2P2 for annual exam.  Several issues noted below.  Past medical history,surgical history, problem list, medications, allergies, family history and social history were all reviewed and documented as reviewed in the EPIC chart.  ROS:  Performed with pertinent positives and negatives included in the history, assessment and plan.   Additional significant findings :  none   Exam: [name] Counsellor Vitals:   05/01/14 1109  BP: 120/76  Height: 5\' 5"  (1.651 m)  Weight: 127 lb (57.607 kg)   General appearance:  Normal affect, orientation and appearance. Skin: Grossly normal HEENT: Without gross lesions.  No cervical or supraclavicular adenopathy. Thyroid normal.  Lungs:  Clear without wheezing, rales or rhonchi Cardiac: RR, without RMG Abdominal:  Soft, nontender, without masses, guarding, rebound, organomegaly or hernia Breasts:  Examined lying and sitting without masses, retractions, discharge or axillary adenopathy.  Well-healed right lumpectomy scar. Pelvic:  Ext/BUS/vagina with atrophic changes  Cervix with atrophic changes  Uterus anteverted, normal size, shape and contour, midline and mobile nontender   Adnexa  Without masses or tenderness    Anus and perineum  Normal   Rectovaginal  Normal sphincter tone without palpated masses or tenderness.    Assessment/Plan:  64 y.o. G2P2 female for annual exam.   1. Postmenopausal/atrophic genital changes. Patient overall doing well without significant hot flushes, night sweats, vaginal dryness her dyspareunia. No vaginal bleeding. Continue to monitor and report any vaginal bleeding. 2. History of right breast cancer status post lumpectomy, radiation and chemotherapy. Had been on tamoxifen, subsequently Evista but has discontinued all proximally one year ago. Actively being followed by Dr. Jana Hakim.  Mammography 03/2014. SBE monthly reviewed. 3. Pap smear/HPV negative 03/2013. No Pap smear  done today. No history of significant abnormal Pap smears previously. Repeat at 3-5 year interval per current screening guidelines. 4. Osteopenia. DEXA 02/2012 T score -1.9. No FRAX due to her prior Evista.  Stable from prior DEXA. Plan repeat in another year or 2. Check vitamin D level today. 5. Colonoscopy 2015. Repeat at their recommended interval. 6. Health maintenance. Patient requests baseline labs. CBC comprehensive metabolic panel lipid profile urinalysis TSH vitamin D ordered. Follow up in one year, sooner as needed.     [name] MD, 11:54 AM 05/01/2014

## 2014-05-01 NOTE — Patient Instructions (Signed)
Will schedule for home sleep testing, and will arrange for followup once results are available.

## 2014-05-02 LAB — URINALYSIS W MICROSCOPIC + REFLEX CULTURE
BACTERIA UA: NONE SEEN
Bilirubin Urine: NEGATIVE
Casts: NONE SEEN
Crystals: NONE SEEN
GLUCOSE, UA: NEGATIVE mg/dL
HGB URINE DIPSTICK: NEGATIVE
Ketones, ur: NEGATIVE mg/dL
LEUKOCYTES UA: NEGATIVE
NITRITE: NEGATIVE
PH: 5 (ref 5.0–8.0)
PROTEIN: NEGATIVE mg/dL
SQUAMOUS EPITHELIAL / LPF: NONE SEEN
Specific Gravity, Urine: 1.013 (ref 1.005–1.030)
Urobilinogen, UA: 0.2 mg/dL (ref 0.0–1.0)

## 2014-05-02 LAB — VITAMIN D 25 HYDROXY (VIT D DEFICIENCY, FRACTURES): Vit D, 25-Hydroxy: 40 ng/mL (ref 30–100)

## 2014-05-04 ENCOUNTER — Other Ambulatory Visit: Payer: Self-pay | Admitting: *Deleted

## 2014-05-04 DIAGNOSIS — Z853 Personal history of malignant neoplasm of breast: Secondary | ICD-10-CM

## 2014-05-05 ENCOUNTER — Telehealth: Payer: Self-pay | Admitting: Oncology

## 2014-05-05 ENCOUNTER — Ambulatory Visit: Payer: BC Managed Care – PPO | Admitting: Oncology

## 2014-05-05 ENCOUNTER — Other Ambulatory Visit: Payer: BC Managed Care – PPO

## 2014-05-07 ENCOUNTER — Other Ambulatory Visit: Payer: Self-pay | Admitting: Oncology

## 2014-05-08 ENCOUNTER — Telehealth: Payer: Self-pay | Admitting: Pulmonary Disease

## 2014-05-08 NOTE — Telephone Encounter (Signed)
HST was denied by her insurance.  [place] please advise on recs.  Thanks  Allergies  Allergen Reactions  . Sulfa Antibiotics Rash    Current Outpatient Prescriptions on File Prior to Visit  Medication Sig Dispense Refill  . BIOTIN PO Take by mouth.    . fish oil-omega-3 fatty acids 1000 MG capsule Take 1 g by mouth daily.      Marland Kitchen glucosamine-chondroitin 500-400 MG tablet Take 1 tablet by mouth 3 (three) times daily.      . Loratadine (CLARITIN PO) Take by mouth daily.      . Multiple Vitamin (MULTIVITAMIN) capsule Take 1 capsule by mouth daily.      . Probiotic Product (PROBIOTIC PO) Take by mouth.     No current facility-administered medications on file prior to visit.

## 2014-05-09 ENCOUNTER — Other Ambulatory Visit: Payer: Self-pay | Admitting: Pulmonary Disease

## 2014-05-09 DIAGNOSIS — G4733 Obstructive sleep apnea (adult) (pediatric): Secondary | ICD-10-CM

## 2014-05-09 NOTE — Telephone Encounter (Signed)
Called and spoke with patient and she is aware that [place] denied HST. Pt is willing to go to for the in lab study. Please send order to [facility] to schedule study for patient. Rhonda J Cobb

## 2014-05-09 NOTE — Telephone Encounter (Signed)
[place] what kind of sleep study would you like? Spilt night?

## 2014-05-09 NOTE — Telephone Encounter (Signed)
Let pt know that her insurance denied her home sleep test because she is not obese.  [employer] is the only insurance company that gives [place] trouble with this. Will need to have a study at the sleep center if she is ok with this.  Let me know.

## 2014-05-09 NOTE — Telephone Encounter (Signed)
Done  Order sent 

## 2014-06-09 ENCOUNTER — Ambulatory Visit (HOSPITAL_BASED_OUTPATIENT_CLINIC_OR_DEPARTMENT_OTHER): Payer: BLUE CROSS/BLUE SHIELD | Admitting: Oncology

## 2014-06-09 ENCOUNTER — Other Ambulatory Visit (HOSPITAL_BASED_OUTPATIENT_CLINIC_OR_DEPARTMENT_OTHER): Payer: BLUE CROSS/BLUE SHIELD

## 2014-06-09 VITALS — BP 119/78 | HR 62 | Temp 98.1°F | Resp 18 | Ht 66.0 in | Wt 125.4 lb

## 2014-06-09 DIAGNOSIS — C50911 Malignant neoplasm of unspecified site of right female breast: Secondary | ICD-10-CM

## 2014-06-09 DIAGNOSIS — Z853 Personal history of malignant neoplasm of breast: Secondary | ICD-10-CM

## 2014-06-09 DIAGNOSIS — G4733 Obstructive sleep apnea (adult) (pediatric): Secondary | ICD-10-CM

## 2014-06-09 DIAGNOSIS — M8580 Other specified disorders of bone density and structure, unspecified site: Secondary | ICD-10-CM

## 2014-06-09 DIAGNOSIS — Z803 Family history of malignant neoplasm of breast: Secondary | ICD-10-CM

## 2014-06-09 LAB — COMPREHENSIVE METABOLIC PANEL (CC13)
ALBUMIN: 3.7 g/dL (ref 3.5–5.0)
ALT: 24 U/L (ref 0–55)
ANION GAP: 10 meq/L (ref 3–11)
AST: 25 U/L (ref 5–34)
Alkaline Phosphatase: 36 U/L — ABNORMAL LOW (ref 40–150)
BUN: 17.4 mg/dL (ref 7.0–26.0)
CO2: 25 meq/L (ref 22–29)
Calcium: 8.9 mg/dL (ref 8.4–10.4)
Chloride: 106 mEq/L (ref 98–109)
Creatinine: 0.7 mg/dL (ref 0.6–1.1)
EGFR: 88 mL/min/{1.73_m2} — ABNORMAL LOW (ref >90)
Glucose: 87 mg/dl (ref 70–140)
POTASSIUM: 3.9 meq/L (ref 3.5–5.1)
SODIUM: 141 meq/L (ref 136–145)
TOTAL PROTEIN: 6.3 g/dL — AB (ref 6.4–8.3)
Total Bilirubin: 0.57 mg/dL (ref 0.20–1.20)

## 2014-06-09 LAB — CBC WITH DIFFERENTIAL/PLATELET
BASO%: 1.2 % (ref 0.0–2.0)
Basophils Absolute: 0 10*3/uL (ref 0.0–0.1)
EOS%: 1.7 % (ref 0.0–7.0)
Eosinophils Absolute: 0.1 10*3/uL (ref 0.0–0.5)
HEMATOCRIT: 40.4 % (ref 34.8–46.6)
HGB: 13.6 g/dL (ref 11.6–15.9)
LYMPH%: 45.4 % (ref 14.0–49.7)
MCH: 30 pg (ref 25.1–34.0)
MCHC: 33.7 g/dL (ref 31.5–36.0)
MCV: 89 fL (ref 79.5–101.0)
MONO#: 0.3 10*3/uL (ref 0.1–0.9)
MONO%: 7.5 % (ref 0.0–14.0)
NEUT#: 1.5 10*3/uL (ref 1.5–6.5)
NEUT%: 44.2 % (ref 38.4–76.8)
Platelets: 138 10*3/uL — ABNORMAL LOW (ref 145–400)
RBC: 4.54 10*6/uL (ref 3.70–5.45)
RDW: 13.9 % (ref 11.2–14.5)
WBC: 3.5 10*3/uL — ABNORMAL LOW (ref 3.9–10.3)
lymph#: 1.6 10*3/uL (ref 0.9–3.3)

## 2014-06-09 NOTE — Progress Notes (Signed)
ID: Heather Mercado   DOB: 01-Mar-1951  MR#: [id number]  WUJ#:[id number]  PCP: Henrine Screws, MD Marcellus Scott) GYN: Donalynn Furlong SU: Alphonsa Overall MD OTHER MD: Arloa Koh MD, Clarene Essex MD, Danton Sewer M.D.  INTERVAL HISTORY: Heather Mercado returns today for followup of her breast cancer. Interval history is generally unremarkable. She took herself off the raloxifene because it was making her feel a bit fatigued, she felt, and she now feels better. She is exercising several times a week. She does quite a bit of pilates. She is having some back tightness, not exactly a soreness or pain.  REVIEW OF SYSTEMS: She complains of some ringing in her ears. She sleeps on 2 pillows and is being reevaluated for sleep apnea. She's had some benign moles removed. A detailed review of systems today was otherwise entirely benign.  PAST MEDICAL HISTORY: Past Medical History  Diagnosis Date  . Personal history of chemotherapy   . History of radiation therapy   . History of breast cancer 12/25/2011    Right  . Osteopenia 02/2012    T score -1.9 stable from prior DEXA    PAST SURGICAL HISTORY: Past Surgical History  Procedure Laterality Date  . Cesarean section  1986  . Achilles tendon repair  1999  . Dilation and curettage of uterus  2004    endometrial polyp  . Breast lumpectomy  2003    right  . Cosmetic surgery      Eye lids    FAMILY HISTORY Family History  Problem Relation Age of Onset  . Breast cancer Mother 4  . Heart disease Father   . Colon cancer Maternal Aunt   . Lung cancer Maternal Uncle    the patient's mother and a maternal great aunt were diagnosed with postmenopausal breast cancer.  GYNECOLOGIC HISTORY: Menarche age 36, first live birth age 35. She is GXP3. She entered menopause in the course of chemotherapy.  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 Her husband Harrie Jeans is in real estate. Son Heather Mercado works at [employer] as than Social worker. Tilman Neat past the  [facility] bar exam February 2014, and got married shortly thereafter. Son Heather Mercado is a sophomore at state.  ADVANCED DIRECTIVES: In place  HEALTH MAINTENANCE: History  Substance Use Topics  . Smoking status: Former Smoker -- 0.50 packs/day for 10 years    Types: Cigarettes    Quit date: 03/24/1982  . Smokeless tobacco: Never Used  . Alcohol Use: 2.4 oz/week    4 Standard drinks or equivalent per week     Comment: 4-5 glasses per week     Colonoscopy:  PAP:  Bone density: December 2013 showed osteopenia with a T score of -1.9  Lipid panel:  Allergies  Allergen Reactions  . Sulfa Antibiotics Rash    Current Outpatient Prescriptions  Medication Sig Dispense Refill  . BIOTIN PO Take by mouth.    . fish oil-omega-3 fatty acids 1000 MG capsule Take 1 g by mouth daily.      Marland Kitchen glucosamine-chondroitin 500-400 MG tablet Take 1 tablet by mouth 3 (three) times daily.      . Loratadine (CLARITIN PO) Take by mouth daily.      . Multiple Vitamin (MULTIVITAMIN) capsule Take 1 capsule by mouth daily.      . Probiotic Product (PROBIOTIC PO) Take by mouth.     No current facility-administered medications for this visit.    OBJECTIVE: Middle-aged white woman in no acute distress Filed Vitals:   06/09/14  1326  BP: 119/78  Pulse: 62  Temp: 98.1 F (36.7 C)  Resp: 18     Body mass index is 20.25 kg/(m^2).    ECOG FS: 0  Sclerae unicteric, pupils equal and reactive Oropharynx clear, dentition in good repair No cervical or supraclavicular adenopathy Lungs no rales or rhonchi Heart regular rate and rhythm Abd soft, nontender, positive bowel sounds MSK no focal spinal tenderness, no upper extremity lymphedema Neuro: nonfocal, well oriented, appropriate affect Breasts: The right breast is status post lumpectomy and radiation. There is some distortion of the normal breast contour and some telangiectasias as previously noted. She is not interested in cosmetic correction. There is no  evidence of local recurrence. The right axilla is benign. Breast is unremarkable    LAB RESULTS: Lab Results  Component Value Date   WBC 3.5* 06/09/2014   NEUTROABS 1.5 06/09/2014   HGB 13.6 06/09/2014   HCT 40.4 06/09/2014   MCV 89.0 06/09/2014   PLT 138* 06/09/2014      Chemistry      Component Value Date/Time   NA 140 05/01/2014 1140   NA 141 04/25/2013 0911   K 4.0 05/01/2014 1140   K 4.2 04/25/2013 0911   CL 104 05/01/2014 1140   CL 105 03/02/2012 0957   CO2 27 05/01/2014 1140   CO2 26 04/25/2013 0911   BUN 19 05/01/2014 1140   BUN 18.9 04/25/2013 0911   CREATININE 0.71 05/01/2014 1140   CREATININE 0.8 04/25/2013 0911   CREATININE 0.80 01/30/2011 1423      Component Value Date/Time   CALCIUM 9.1 05/01/2014 1140   CALCIUM 9.1 04/25/2013 0911   ALKPHOS 33* 05/01/2014 1140   ALKPHOS 30* 04/25/2013 0911   AST 21 05/01/2014 1140   AST 22 04/25/2013 0911   ALT 21 05/01/2014 1140   ALT 24 04/25/2013 0911   BILITOT 0.6 05/01/2014 1140   BILITOT 0.58 04/25/2013 0911       Lab Results  Component Value Date   LABCA2 12 03/02/2012    No components found for: [id number]  No results for input(s): INR in the last 168 hours.  Urinalysis    Component Value Date/Time   COLORURINE YELLOW 05/01/2014 1140    STUDIES: CLINICAL DATA: Screening.  EXAM: DIGITAL SCREENING BILATERAL MAMMOGRAM WITH 3D TOMO WITH CAD  COMPARISON: Previous exam(s).  ACR Breast Density Category c: The breast tissue is heterogeneously dense, which may obscure small masses.  FINDINGS: There are no findings suspicious for malignancy. Images were processed with CAD.  IMPRESSION: No mammographic evidence of malignancy. A result letter of this screening mammogram will be mailed directly to the patient.  RECOMMENDATION: Screening mammogram in one year. (Code:SM-B-01Y)  BI-RADS CATEGORY 1: Negative.   Electronically Signed  By: Lovey Newcomer M.D.  On: 04/12/2014  12:54  ASSESSMENT: 64 y.o. status post right lumpectomy and sentinel lymph node biopsy February of 2003 for a T1c N1(mic), Stage IB invasive ductal carcinoma, status post cyclophosphamide and doxorubicin dose dense therapy x4 followed by radiation, then on tamoxifen for 1 year with poor tolerance, on exemestane between May of 2005 and May of 2010, at which point she switched to raloxifene, discontinued 2015.Marland Kitchen   PLAN: Jaeanna is doing terrific all 13 years out from her initial diagnosis of breast cancer. There is no evidence of disease recurrence. She strongly prefers to be followed here on a yearly basis and I am glad to accommodate that.  Reviewing her chart, I note that she was 50 at  the time of diagnosis and that she has 2 relatives with a history of breast cancer. This may qualify her for genetic testing. I'm checking with our genetics counselor regarding this and if she does qualify I will give Nyiesha a call.  Otherwise she will see me again next January, after her mammogram in December of this year. She knows to call for any other issues that may develop before her next visit here.  MAGRINAT,GUSTAV C    06/09/2014

## 2014-06-10 ENCOUNTER — Other Ambulatory Visit: Payer: Self-pay | Admitting: Oncology

## 2014-06-11 DIAGNOSIS — C50911 Malignant neoplasm of unspecified site of right female breast: Secondary | ICD-10-CM | POA: Insufficient documentation

## 2014-06-12 ENCOUNTER — Telehealth: Payer: Self-pay | Admitting: Oncology

## 2014-06-12 NOTE — Telephone Encounter (Signed)
per pof to sch pt appt-cld pt & left pt message of time & date of appt

## 2014-07-19 ENCOUNTER — Ambulatory Visit (HOSPITAL_BASED_OUTPATIENT_CLINIC_OR_DEPARTMENT_OTHER): Payer: BLUE CROSS/BLUE SHIELD | Attending: Pulmonary Disease | Admitting: Radiology

## 2014-07-19 VITALS — Ht 65.5 in | Wt 125.0 lb

## 2014-07-19 DIAGNOSIS — G4733 Obstructive sleep apnea (adult) (pediatric): Secondary | ICD-10-CM

## 2014-07-19 DIAGNOSIS — G4710 Hypersomnia, unspecified: Secondary | ICD-10-CM | POA: Diagnosis present

## 2014-07-21 ENCOUNTER — Telehealth: Payer: Self-pay | Admitting: Pulmonary Disease

## 2014-07-21 DIAGNOSIS — G4733 Obstructive sleep apnea (adult) (pediatric): Secondary | ICD-10-CM | POA: Diagnosis not present

## 2014-07-21 NOTE — Telephone Encounter (Signed)
Called spoke with pt. Appt scheduled for 08/01/14 at 4:30

## 2014-07-21 NOTE — Sleep Study (Signed)
   NAME: Heather Mercado DATE OF BIRTH:  1950-06-08 MEDICAL RECORD NUMBER [id number]  LOCATION: [facility]  PHYSICIAN: Kathee Delton  DATE OF STUDY: 07/19/2014  SLEEP STUDY TYPE: Nocturnal Polysomnogram               REFERRING PHYSICIAN: Yukari Flax, Armando Reichert, MD  INDICATION FOR STUDY: Hypersomnia with sleep apnea  EPWORTH SLEEPINESS SCORE:  5  HEIGHT: 5' 5.5" (166.4 cm)  WEIGHT: 125 lb (56.7 kg)    Body mass index is 20.48 kg/(m^2).  NECK SIZE: 12 in.  MEDICATIONS: Reviewed in the sleep record  SLEEP ARCHITECTURE: The patient had a total sleep time of 255 minutes with very little slow-wave sleep and only 53 minutes of REM. Sleep onset latency was normal at 31 minutes, and REM onset was normal at 113 minutes. Sleep efficiency was moderately reduced at 70%.  RESPIRATORY DATA: The patient was found to have 25 apneas and 16 obstructive hypopneas, giving her an AHI of 10 events per hour. The events were not positional, but did primarily occurred during REM. Her REM AHI was 33 events per hour. There was moderate snoring noted throughout.  OXYGEN DATA: The patient had oxygen desaturation as low as 71%, primarily occurring with her REM events.  CARDIAC DATA: No clinically significant arrhythmias were noted  MOVEMENT/PARASOMNIA: The patient had 54 periodic limb movements, with 3 per hour resulting in arousal or awakening.  IMPRESSION/ RECOMMENDATION:    1) mild obstructive sleep apnea, with an AHI of 10 events per hour and oxygen desaturation as low as 71%. The majority of her events occurred during REM, and her REM AHI was 33 events per hour. Treatment for this degree of sleep apnea can include possible REM suppressants, dental appliance, and also his C Pap. Clinical correlation is suggested.  2) moderate numbers of periodic limb movements with some sleep disruption. Clinical correlation is suggested.    [place], American Board of Sleep  Medicine  ELECTRONICALLY SIGNED ON:  07/21/2014, 8:50 AM [place] PH: [phone]   FX: [phone] [place]

## 2014-07-21 NOTE — Telephone Encounter (Signed)
Pt needs ov to review her sleep study 

## 2014-08-01 ENCOUNTER — Encounter: Payer: Self-pay | Admitting: Pulmonary Disease

## 2014-08-01 ENCOUNTER — Ambulatory Visit (INDEPENDENT_AMBULATORY_CARE_PROVIDER_SITE_OTHER): Payer: BLUE CROSS/BLUE SHIELD | Admitting: Pulmonary Disease

## 2014-08-01 ENCOUNTER — Encounter (INDEPENDENT_AMBULATORY_CARE_PROVIDER_SITE_OTHER): Payer: Self-pay

## 2014-08-01 DIAGNOSIS — G4733 Obstructive sleep apnea (adult) (pediatric): Secondary | ICD-10-CM | POA: Diagnosis not present

## 2014-08-01 NOTE — Progress Notes (Signed)
   Subjective:    Patient ID: Heather Mercado, female    DOB: 1950-10-16, 64 y.o.   MRN: [id number]  HPI The patient comes in today for follow-up of her recent sleep study. She was found to have mild obstructive sleep apnea, with an AHI of 10 events per hour. Her events more commonly occurred during REM, and she did have transient desaturation as low as 71% during REM. She was also found to have small to moderate numbers of limb movements during the night, but has no symptoms suggestive of RLS or PLMD.  I have reviewed her study with her in detail, and answered all of her questions.   Review of Systems  Constitutional: Negative for fever and unexpected weight change.  HENT: Negative for congestion, dental problem, ear pain, nosebleeds, postnasal drip, rhinorrhea, sinus pressure, sneezing, sore throat and trouble swallowing.   Eyes: Negative for redness and itching.  Respiratory: Negative for cough, chest tightness, shortness of breath and wheezing.   Cardiovascular: Negative for palpitations and leg swelling.  Gastrointestinal: Negative for nausea and vomiting.  Genitourinary: Negative for dysuria.  Musculoskeletal: Negative for joint swelling.  Skin: Negative for rash.  Neurological: Negative for headaches.  Hematological: Does not bruise/bleed easily.  Psychiatric/Behavioral: Negative for dysphoric mood. The patient is not nervous/anxious.        Objective:   Physical Exam Thin female in no acute distress Nose without purulence or discharge noted Neck without lymphadenopathy or thyromegaly Lower extremities without edema, no cyanosis Alert and oriented, moves all 4 extremities.       Assessment & Plan:

## 2014-08-01 NOTE — Assessment & Plan Note (Signed)
The patient has mild obstructive sleep apnea by her recent study, but it is more prominent during REM. She does not have weight to lose, and therefore she will either need to live with this or consider treatment with a dental appliance. The good news here is that it is not significant enough to impact her cardiovascular health. The decision to treat this aggressively will depend upon its impact to her quality of life.  At this point, she is interested in treating her sleep apnea, and would like to consider a dental appliance. I will refer her to dental medicine for evaluation.

## 2014-08-01 NOTE — Patient Instructions (Signed)
Will refer to Dr. Oneal Grout for possible dental appliance. followup with [place] as needed.  If you wish to consider cpap, would have you followup with Dr. Halford Chessman.

## 2015-03-08 ENCOUNTER — Other Ambulatory Visit: Payer: Self-pay

## 2015-03-08 DIAGNOSIS — Z1231 Encounter for screening mammogram for malignant neoplasm of breast: Secondary | ICD-10-CM

## 2015-04-13 ENCOUNTER — Other Ambulatory Visit: Payer: Self-pay | Admitting: *Deleted

## 2015-04-13 DIAGNOSIS — C50911 Malignant neoplasm of unspecified site of right female breast: Secondary | ICD-10-CM

## 2015-04-16 ENCOUNTER — Other Ambulatory Visit: Payer: BLUE CROSS/BLUE SHIELD

## 2015-04-16 ENCOUNTER — Ambulatory Visit: Payer: BLUE CROSS/BLUE SHIELD

## 2015-04-16 ENCOUNTER — Ambulatory Visit: Payer: BLUE CROSS/BLUE SHIELD | Admitting: Oncology

## 2015-04-16 NOTE — Progress Notes (Signed)
No show

## 2015-04-17 ENCOUNTER — Encounter: Payer: Self-pay | Admitting: Oncology

## 2015-04-27 ENCOUNTER — Ambulatory Visit: Payer: BLUE CROSS/BLUE SHIELD

## 2015-06-26 ENCOUNTER — Ambulatory Visit
Admission: RE | Admit: 2015-06-26 | Discharge: 2015-06-26 | Disposition: A | Discharge status: Home / Self Care | Payer: BLUE CROSS/BLUE SHIELD | Source: Ambulatory Visit

## 2015-06-26 DIAGNOSIS — Z1231 Encounter for screening mammogram for malignant neoplasm of breast: Secondary | ICD-10-CM

## 2015-08-27 ENCOUNTER — Encounter: Payer: Self-pay | Admitting: Pulmonary Disease

## 2015-08-27 ENCOUNTER — Ambulatory Visit (INDEPENDENT_AMBULATORY_CARE_PROVIDER_SITE_OTHER): Payer: BLUE CROSS/BLUE SHIELD | Admitting: Pulmonary Disease

## 2015-08-27 VITALS — BP 110/66 | HR 69 | Ht 65.6 in | Wt 127.0 lb

## 2015-08-27 DIAGNOSIS — G4733 Obstructive sleep apnea (adult) (pediatric): Secondary | ICD-10-CM

## 2015-08-27 NOTE — Assessment & Plan Note (Signed)
She only has mild OSA, mainly REM related We discussed that the cardiovascular consequences of mild OSA are debatable. The reason to treat her as because she is symptomatic  Trial of auto CPAP 5-10 cm with nasal pillows Reassess in 1 month for improvement of symptoms of fatigue and headaches Call us with any problems

## 2015-08-27 NOTE — Patient Instructions (Signed)
Trial of auto CPAP 5-10 cm with nasal pillows Reassess in 1 month for improvement of symptoms of fatigue and headaches Call us with any problems

## 2015-08-27 NOTE — Progress Notes (Signed)
   Subjective:    Patient ID: Heather Mercado, female    DOB: Nov 06, 1950, 65 y.o.   MRN: [id number]  HPI  65 year old with mild OSA, mainly REM-related  PSG 06/2014  showed RDI 11/hour, mainly REM related  she did have transient desaturation as low as 71% during REM. She was also found to have small to moderate numbers of limb movements during the night, but has no symptoms suggestive of RLS or PLMD  Chief Complaint  Patient presents with  . Follow-up    former [facility] pt states she tried using dental appliance was unable to use it. wanting to try cpap   She spent $3000 getting a oral appliance which was unable to tolerate this She is now willing to trial CPAP She reports morning headaches and daytime fatigue She does not take daily naps She would just like to feel better She does not seem to have significant cardiovascular disease   Review of Systems neg for any significant sore throat, dysphagia, itching, sneezing, nasal congestion or excess/ purulent secretions, fever, chills, sweats, unintended wt loss, pleuritic or exertional cp, hempoptysis, orthopnea pnd or change in chronic leg swelling. Also denies presyncope, palpitations, heartburn, abdominal pain, nausea, vomiting, diarrhea or change in bowel or urinary habits, dysuria,hematuria, rash, arthralgias, visual complaints, headache, numbness weakness or ataxia.     Objective:   Physical Exam  Gen. Pleasant, well-nourished, in no distress ENT - no lesions, no post nasal drip Neck: No JVD, no thyromegaly, no carotid bruits Lungs: no use of accessory muscles, no dullness to percussion, clear without rales or rhonchi  Cardiovascular: Rhythm regular, heart sounds  normal, no murmurs or gallops, no peripheral edema Musculoskeletal: No deformities, no cyanosis or clubbing        Assessment & Plan:

## 2015-10-16 ENCOUNTER — Telehealth: Payer: Self-pay | Admitting: Pulmonary Disease

## 2015-10-16 NOTE — Telephone Encounter (Signed)
Per Dr. Elsworth Soho:  Compliance report shows average pressure 8cm - no residuals, good usage - continue current settings.

## 2015-10-17 NOTE — Telephone Encounter (Signed)
Lm x 1 

## 2015-10-22 ENCOUNTER — Encounter: Payer: Self-pay | Admitting: Pulmonary Disease

## 2015-10-22 NOTE — Telephone Encounter (Signed)
Pt returning call for results and can be reached @ [phone].Heather Mercado

## 2015-10-22 NOTE — Telephone Encounter (Signed)
Follow-up 2-3 months with TP review symptoms

## 2015-10-22 NOTE — Telephone Encounter (Signed)
Spoke with pt, aware of recs.  States she is doing very well on nasal pillows.  Pt would like to know when she needs to follow up.  At last OV she was told to follow up mid July to review download.  Pt would like to know when she needs to follow up.  RA please advise.  Thanks!

## 2015-10-23 ENCOUNTER — Ambulatory Visit: Payer: BLUE CROSS/BLUE SHIELD | Admitting: Pulmonary Disease

## 2015-10-23 NOTE — Telephone Encounter (Signed)
Patient scheduled to see TP on 01/23/2016 at 11:15am.  Patient aware of appointment.   Nothing further needed.

## 2016-01-23 ENCOUNTER — Ambulatory Visit: Payer: BLUE CROSS/BLUE SHIELD | Admitting: Adult Health

## 2016-01-30 ENCOUNTER — Ambulatory Visit: Payer: BLUE CROSS/BLUE SHIELD | Admitting: Adult Health

## 2016-07-04 ENCOUNTER — Other Ambulatory Visit: Payer: Self-pay | Admitting: Oncology

## 2016-07-04 DIAGNOSIS — Z1231 Encounter for screening mammogram for malignant neoplasm of breast: Secondary | ICD-10-CM

## 2016-07-23 ENCOUNTER — Ambulatory Visit: Payer: BLUE CROSS/BLUE SHIELD

## 2016-08-06 ENCOUNTER — Ambulatory Visit
Admission: RE | Admit: 2016-08-06 | Discharge: 2016-08-06 | Disposition: A | Discharge status: Home / Self Care | Payer: BLUE CROSS/BLUE SHIELD | Source: Ambulatory Visit | Attending: Oncology | Admitting: Oncology

## 2016-08-06 DIAGNOSIS — Z1231 Encounter for screening mammogram for malignant neoplasm of breast: Secondary | ICD-10-CM

## 2016-08-06 HISTORY — DX: Personal history of irradiation: Z92.3

## 2016-08-08 ENCOUNTER — Other Ambulatory Visit: Payer: Self-pay | Admitting: Oncology

## 2016-08-08 DIAGNOSIS — R928 Other abnormal and inconclusive findings on diagnostic imaging of breast: Secondary | ICD-10-CM

## 2016-08-14 ENCOUNTER — Ambulatory Visit
Admission: RE | Admit: 2016-08-14 | Discharge: 2016-08-14 | Disposition: A | Discharge status: Home / Self Care | Payer: BLUE CROSS/BLUE SHIELD | Source: Ambulatory Visit | Attending: Oncology | Admitting: Oncology

## 2016-08-14 DIAGNOSIS — R928 Other abnormal and inconclusive findings on diagnostic imaging of breast: Secondary | ICD-10-CM

## 2017-01-23 DIAGNOSIS — Z23 Encounter for immunization: Secondary | ICD-10-CM | POA: Diagnosis not present

## 2017-04-20 DIAGNOSIS — D72819 Decreased white blood cell count, unspecified: Secondary | ICD-10-CM | POA: Diagnosis not present

## 2017-04-20 DIAGNOSIS — Z0000 Encounter for general adult medical examination without abnormal findings: Secondary | ICD-10-CM | POA: Diagnosis not present

## 2017-04-20 DIAGNOSIS — R69 Illness, unspecified: Secondary | ICD-10-CM | POA: Diagnosis not present

## 2017-04-20 DIAGNOSIS — R7989 Other specified abnormal findings of blood chemistry: Secondary | ICD-10-CM | POA: Diagnosis not present

## 2017-04-20 DIAGNOSIS — Z853 Personal history of malignant neoplasm of breast: Secondary | ICD-10-CM | POA: Diagnosis not present

## 2017-04-20 DIAGNOSIS — E559 Vitamin D deficiency, unspecified: Secondary | ICD-10-CM | POA: Diagnosis not present

## 2017-04-20 DIAGNOSIS — Z23 Encounter for immunization: Secondary | ICD-10-CM | POA: Diagnosis not present

## 2017-04-20 DIAGNOSIS — Z8639 Personal history of other endocrine, nutritional and metabolic disease: Secondary | ICD-10-CM | POA: Diagnosis not present

## 2017-04-20 DIAGNOSIS — R946 Abnormal results of thyroid function studies: Secondary | ICD-10-CM | POA: Diagnosis not present

## 2017-04-20 DIAGNOSIS — G4733 Obstructive sleep apnea (adult) (pediatric): Secondary | ICD-10-CM | POA: Diagnosis not present

## 2017-06-08 ENCOUNTER — Ambulatory Visit: Payer: Self-pay | Admitting: Nurse Practitioner

## 2017-06-08 ENCOUNTER — Encounter: Payer: Self-pay | Admitting: Nurse Practitioner

## 2017-06-08 VITALS — BP 98/70 | HR 80 | Temp 99.0°F | Wt 130.6 lb

## 2017-06-08 DIAGNOSIS — J40 Bronchitis, not specified as acute or chronic: Secondary | ICD-10-CM

## 2017-06-08 DIAGNOSIS — J0100 Acute maxillary sinusitis, unspecified: Secondary | ICD-10-CM

## 2017-06-08 MED ORDER — AMOXICILLIN-POT CLAVULANATE 875-125 MG PO TABS: 1.0000 | ORAL_TABLET | Freq: Two times a day (BID) | ORAL | 0 refills | Status: AC

## 2017-06-08 MED ORDER — ALBUTEROL SULFATE HFA 108 (90 BASE) MCG/ACT IN AERS: 2.0000 | INHALATION_SPRAY | Freq: Four times a day (QID) | RESPIRATORY_TRACT | 0 refills | Status: DC | PRN

## 2017-06-08 MED ORDER — FLUTICASONE PROPIONATE 50 MCG/ACT NA SUSP: 2.0000 | Freq: Every day | NASAL | 0 refills | Status: DC

## 2017-06-08 NOTE — Progress Notes (Signed)
Subjective:  Heather Mercado is a 67 y.o. female who presents for evaluation of URI like symptoms.  Symptoms include left ear pressure/pain, achiness, cough described as productive of green sputum, fever: fevers up to 100 degrees, nasal congestion, post nasal drip and sinus pressure.  Onset of symptoms was 5 weeks ago, and has been gradually worsening since that time.  Treatment to date:  antibiotics and cough suppressants.  High risk factors for influenza complications:  none.  The following portions of the patient's history were reviewed and updated as appropriate:  allergies, current medications and past medical history.  Constitutional: positive for anorexia, chills, fatigue and fevers, negative for night sweats, sweats and weight loss Eyes: negative Ears, nose, mouth, throat, and face: positive for nasal congestion, sore throat and ear fullness, negative for ear drainage, earaches and hoarseness Respiratory: positive for cough, sputum and wheezing, negative for asthma, chronic bronchitis, pneumonia and stridor Cardiovascular: negative Gastrointestinal: positive for decreased appetite, negative for abdominal pain, constipation, diarrhea, nausea and vomiting Neurological: positive for headaches, negative for coordination problems, dizziness, paresthesia, seizures, vertigo and weakness Allergic/Immunologic: positive for hay fever Objective:  BP 98/70   Pulse 80   Temp 99 F (37.2 C)   Wt 130 lb 9.6 oz (59.2 kg)   SpO2 97%   BMI 21.34 kg/m  General appearance: alert, cooperative, fatigued and no distress Head: Normocephalic, without obvious abnormality, atraumatic Eyes: conjunctivae/corneas clear. PERRL, EOM's intact. Fundi benign. Ears: normal TM's and external ear canals both ears Nose: no discharge, turbinates swollen, inflamed, moderate maxillary sinus tenderness bilateral, mild frontal sinus tenderness bilateral Throat: lips, mucosa, and tongue normal; teeth and gums normal Lungs:  wheezes posterior - RLL and LLL Heart: regular rate and rhythm, S1, S2 normal, no murmur, click, rub or gallop Abdomen: soft, non-tender; bowel sounds normal; no masses,  no organomegaly Pulses: 2+ and symmetric Skin: Skin color, texture, turgor normal. No rashes or lesions Lymph nodes: cervical and submandibular nodes normal Neurologic: Grossly normal    Assessment:  Acute Maxillary Sinusitis and Acute Bronchitis    Plan:  Discussed diagnosis and treatment of sinusitis. Educational material distributed and questions answered. Suggested symptomatic OTC remedies. Supportive care with appropriate antipyretics and fluids. Nasal saline spray for congestion. Augmentin, Flonase and Proventil Inhaler prescribed per orders. Follow up as needed.  Patient to use humidifier at home, sleep elevated on 2 pillows.  Increase fluids.  Patient verbalizes understanding and has no questions at time of discharge. Meds ordered this encounter  Medications  . amoxicillin-clavulanate (AUGMENTIN) 875-125 MG tablet    Sig: Take 1 tablet by mouth 2 (two) times daily for 10 days.    Dispense:  20 tablet    Refill:  0    Order Specific Question:   Supervising Provider    Answer:   Ricard Dillon [[id number]]  . fluticasone (FLONASE) 50 MCG/ACT nasal spray    Sig: Place 2 sprays into both nostrils daily for 10 days.    Dispense:  16 g    Refill:  0    Order Specific Question:   Supervising Provider    Answer:   Ricard Dillon [[id number]]  . albuterol (PROVENTIL HFA) 108 (90 Base) MCG/ACT inhaler    Sig: Inhale 2 puffs into the lungs every 6 (six) hours as needed for up to 10 days for wheezing or shortness of breath (wheezing).    Dispense:  1 Inhaler    Refill:  0    Order Specific Question:  Supervising Provider    Answer:   Ricard Dillon [phone]

## 2017-06-08 NOTE — Patient Instructions (Addendum)
Sinusitis, Adult  Sinusitis is soreness and inflammation of your sinuses. Sinuses are hollow spaces in the bones around your face. Your sinuses are located:  Around your eyes.  In the middle of your forehead.  Behind your nose.  In your cheekbones.    Your sinuses and nasal passages are lined with a stringy fluid (mucus). Mucus normally drains out of your sinuses. When your nasal tissues become inflamed or swollen, the mucus can become trapped or blocked so air cannot flow through your sinuses. This allows bacteria, viruses, and funguses to grow, which leads to infection.  Sinusitis can develop quickly and last for 7?10 days (acute) or for more than 12 weeks (chronic). Sinusitis often develops after a cold.  What are the causes?  This condition is caused by anything that creates swelling in the sinuses or stops mucus from draining, including:  Allergies.  Asthma.  Bacterial or viral infection.  Abnormally shaped bones between the nasal passages.  Nasal growths that contain mucus (nasal polyps).  Narrow sinus openings.  Pollutants, such as chemicals or irritants in the air.  A foreign object stuck in the nose.  A fungal infection. This is rare.    What increases the risk?  The following factors may make you more likely to develop this condition:  Having allergies or asthma.  Having had a recent cold or respiratory tract infection.  Having structural deformities or blockages in your nose or sinuses.  Having a weak immune system.  Doing a lot of swimming or diving.  Overusing nasal sprays.  Smoking.    What are the signs or symptoms?  The main symptoms of this condition are pain and a feeling of pressure around the affected sinuses. Other symptoms include:  Upper toothache.  Earache.  Headache.  Bad breath.  Decreased sense of smell and taste.  A cough that may get worse at night.  Fatigue.  Fever.  Thick drainage from your nose. The drainage is often green and it may contain pus (purulent).  Stuffy nose or  congestion.  Postnasal drip. This is when extra mucus collects in the throat or back of the nose.  Swelling and warmth over the affected sinuses.  Sore throat.  Sensitivity to light.    How is this diagnosed?  This condition is diagnosed based on symptoms, a medical history, and a physical exam. To find out if your condition is acute or chronic, your health care provider may:  Look in your nose for signs of nasal polyps.  Tap over the affected sinus to check for signs of infection.  View the inside of your sinuses using an imaging device that has a light attached (endoscope).    If your health care provider suspects that you have chronic sinusitis, you may also:  Be tested for allergies.  Have a sample of mucus taken from your nose (nasal culture) and checked for bacteria.  Have a mucus sample examined to see if your sinusitis is related to an allergy.    If your sinusitis does not respond to treatment and it lasts longer than 8 weeks, you may have an MRI or CT scan to check your sinuses. These scans also help to determine how severe your infection is.  In rare cases, a bone biopsy may be done to rule out more serious types of fungal sinus disease.  How is this treated?  Treatment for sinusitis depends on the cause and whether your condition is chronic or acute. If a virus is causing   your sinusitis, your symptoms will go away on their own within 10 days. You may be given medicines to relieve your symptoms, including:  Topical nasal decongestants. They shrink swollen nasal passages and let mucus drain from your sinuses.  Antihistamines. These drugs block inflammation that is triggered by allergies. This can help to ease swelling in your nose and sinuses.  Topical nasal corticosteroids. These are nasal sprays that ease inflammation and swelling in your nose and sinuses.  Nasal saline washes. These rinses can help to get rid of thick mucus in your nose.    If your condition is caused by bacteria, you will be given an  antibiotic medicine. If your condition is caused by a fungus, you will be given an antifungal medicine.  Surgery may be needed to correct underlying conditions, such as narrow nasal passages. Surgery may also be needed to remove polyps.  Follow these instructions at home:  Medicines  Take, use, or apply over-the-counter and prescription medicines only as told by your health care provider. These may include nasal sprays.  If you were prescribed an antibiotic medicine, take it as told by your health care provider. Do not stop taking the antibiotic even if you start to feel better.  Hydrate and Humidify  Drink enough water to keep your urine clear or pale yellow. Staying hydrated will help to thin your mucus.  Use a cool mist humidifier to keep the humidity level in your home above 50%.  Inhale steam for 10-15 minutes, 3-4 times a day or as told by your health care provider. You can do this in the bathroom while a hot shower is running.  Limit your exposure to cool or dry air.  Rest  Rest as much as possible.  Sleep with your head raised (elevated).  Make sure to get enough sleep each night.  General instructions  Apply a warm, moist washcloth to your face 3-4 times a day or as told by your health care provider. This will help with discomfort.  Wash your hands often with soap and water to reduce your exposure to viruses and other germs. If soap and water are not available, use hand sanitizer.  Do not smoke. Avoid being around people who are smoking (secondhand smoke).  Keep all follow-up visits as told by your health care provider. This is important.  Contact a health care provider if:  You have a fever.  Your symptoms get worse.  Your symptoms do not improve within 10 days.  Get help right away if:  You have a severe headache.  You have persistent vomiting.  You have pain or swelling around your face or eyes.  You have vision problems.  You develop confusion.  Your neck is stiff.  You have trouble breathing.  This  information is not intended to replace advice given to you by your health care provider. Make sure you discuss any questions you have with your health care provider.  Document Released: 03/10/2005 Document Revised: 11/04/2015 Document Reviewed: 01/03/2015  Elsevier Interactive Patient Education  2018 [employer]  Acute Bronchitis, Adult  Acute bronchitis is sudden (acute) swelling of the air tubes (bronchi) in the lungs. Acute bronchitis causes these tubes to fill with mucus, which can make it hard to breathe. It can also cause coughing or wheezing.  In adults, acute bronchitis usually goes away within 2 weeks. A cough caused by bronchitis may last up to 3 weeks. Smoking, allergies, and asthma can make the condition worse. Repeated episodes of bronchitis   may cause further lung problems, such as chronic obstructive pulmonary disease (COPD).  What are the causes?  This condition can be caused by germs and by substances that irritate the lungs, including:   Cold and flu viruses. This condition is most often caused by the same virus that causes a cold.   Bacteria.   Exposure to tobacco smoke, dust, fumes, and air pollution.    What increases the risk?  This condition is more likely to develop in people who:   Have close contact with someone with acute bronchitis.   Are exposed to lung irritants, such as tobacco smoke, dust, fumes, and vapors.   Have a weak immune system.   Have a respiratory condition such as asthma.    What are the signs or symptoms?  Symptoms of this condition include:   A cough.   Coughing up clear, yellow, or green mucus.   Wheezing.   Chest congestion.   Shortness of breath.   A fever.   Body aches.   Chills.   A sore throat.    How is this diagnosed?  This condition is usually diagnosed with a physical exam. During the exam, your health care provider may order tests, such as chest X-rays, to rule out other conditions. He or she may also:   Test a sample of your mucus for  bacterial infection.   Check the level of oxygen in your blood. This is done to check for pneumonia.   Do a chest X-ray or lung function testing to rule out pneumonia and other conditions.   Perform blood tests.    Your health care provider will also ask about your symptoms and medical history.  How is this treated?  Most cases of acute bronchitis clear up over time without treatment. Your health care provider may recommend:   Drinking more fluids. Drinking more makes your mucus thinner, which may make it easier to breathe.   Taking a medicine for a fever or cough.   Taking an antibiotic medicine.   Using an inhaler to help improve shortness of breath and to control a cough.   Using a cool mist vaporizer or humidifier to make it easier to breathe.    Follow these instructions at home:  Medicines   Take over-the-counter and prescription medicines only as told by your health care provider.   If you were prescribed an antibiotic, take it as told by your health care provider. Do not stop taking the antibiotic even if you start to feel better.  General instructions   Get plenty of rest.   Drink enough fluids to keep your urine clear or pale yellow.   Avoid smoking and secondhand smoke. Exposure to cigarette smoke or irritating chemicals will make bronchitis worse. If you smoke and you need help quitting, ask your health care provider. Quitting smoking will help your lungs heal faster.   Use an inhaler, cool mist vaporizer, or humidifier as told by your health care provider.   Keep all follow-up visits as told by your health care provider. This is important.  How is this prevented?  To lower your risk of getting this condition again:   Wash your hands often with soap and water. If soap and water are not available, use hand sanitizer.   Avoid contact with people who have cold symptoms.   Try not to touch your hands to your mouth, nose, or eyes.   Make sure to get the flu shot every year.      Contact a  health care provider if:   Your symptoms do not improve in 2 weeks of treatment.  Get help right away if:   You cough up blood.   You have chest pain.   You have severe shortness of breath.   You become dehydrated.   You faint or keep feeling like you are going to faint.   You keep vomiting.   You have a severe headache.   Your fever or chills gets worse.  This information is not intended to replace advice given to you by your health care provider. Make sure you discuss any questions you have with your health care provider.  Document Released: 04/17/2004 Document Revised: 10/03/2015 Document Reviewed: 08/29/2015  Elsevier Interactive Patient Education  2018 Elsevier Inc.

## 2017-06-11 ENCOUNTER — Telehealth: Payer: Self-pay

## 2017-07-08 DIAGNOSIS — H5213 Myopia, bilateral: Secondary | ICD-10-CM | POA: Diagnosis not present

## 2017-07-08 DIAGNOSIS — H2513 Age-related nuclear cataract, bilateral: Secondary | ICD-10-CM | POA: Diagnosis not present

## 2017-07-08 DIAGNOSIS — H25013 Cortical age-related cataract, bilateral: Secondary | ICD-10-CM | POA: Diagnosis not present

## 2017-07-08 DIAGNOSIS — H52203 Unspecified astigmatism, bilateral: Secondary | ICD-10-CM | POA: Diagnosis not present

## 2017-07-08 DIAGNOSIS — H524 Presbyopia: Secondary | ICD-10-CM | POA: Diagnosis not present

## 2017-07-27 DIAGNOSIS — K5730 Diverticulosis of large intestine without perforation or abscess without bleeding: Secondary | ICD-10-CM | POA: Diagnosis not present

## 2017-07-27 DIAGNOSIS — Z86010 Personal history of colonic polyps: Secondary | ICD-10-CM | POA: Diagnosis not present

## 2017-07-27 DIAGNOSIS — Z8371 Family history of colonic polyps: Secondary | ICD-10-CM | POA: Diagnosis not present

## 2017-07-30 ENCOUNTER — Other Ambulatory Visit: Payer: Self-pay | Admitting: Internal Medicine

## 2017-07-30 DIAGNOSIS — Z1231 Encounter for screening mammogram for malignant neoplasm of breast: Secondary | ICD-10-CM

## 2017-08-21 ENCOUNTER — Ambulatory Visit
Admission: RE | Admit: 2017-08-21 | Discharge: 2017-08-21 | Disposition: A | Discharge status: Home / Self Care | Payer: Medicare HMO | Source: Ambulatory Visit | Attending: Internal Medicine | Admitting: Internal Medicine

## 2017-08-21 DIAGNOSIS — Z1231 Encounter for screening mammogram for malignant neoplasm of breast: Secondary | ICD-10-CM | POA: Diagnosis not present

## 2017-09-11 DIAGNOSIS — L570 Actinic keratosis: Secondary | ICD-10-CM | POA: Diagnosis not present

## 2017-09-11 DIAGNOSIS — I781 Nevus, non-neoplastic: Secondary | ICD-10-CM | POA: Diagnosis not present

## 2017-11-27 ENCOUNTER — Encounter: Payer: Self-pay | Admitting: Licensed Clinical Social Worker

## 2017-12-07 DIAGNOSIS — H25013 Cortical age-related cataract, bilateral: Secondary | ICD-10-CM | POA: Diagnosis not present

## 2017-12-07 DIAGNOSIS — H2513 Age-related nuclear cataract, bilateral: Secondary | ICD-10-CM | POA: Diagnosis not present

## 2017-12-14 DIAGNOSIS — D1801 Hemangioma of skin and subcutaneous tissue: Secondary | ICD-10-CM | POA: Diagnosis not present

## 2017-12-14 DIAGNOSIS — L814 Other melanin hyperpigmentation: Secondary | ICD-10-CM | POA: Diagnosis not present

## 2017-12-28 DIAGNOSIS — Z411 Encounter for cosmetic surgery: Secondary | ICD-10-CM | POA: Diagnosis not present

## 2017-12-28 DIAGNOSIS — L821 Other seborrheic keratosis: Secondary | ICD-10-CM | POA: Diagnosis not present

## 2017-12-28 DIAGNOSIS — Z23 Encounter for immunization: Secondary | ICD-10-CM | POA: Diagnosis not present

## 2017-12-28 DIAGNOSIS — D225 Melanocytic nevi of trunk: Secondary | ICD-10-CM | POA: Diagnosis not present

## 2017-12-28 DIAGNOSIS — L570 Actinic keratosis: Secondary | ICD-10-CM | POA: Diagnosis not present

## 2017-12-28 DIAGNOSIS — L929 Granulomatous disorder of the skin and subcutaneous tissue, unspecified: Secondary | ICD-10-CM | POA: Diagnosis not present

## 2017-12-28 DIAGNOSIS — D485 Neoplasm of uncertain behavior of skin: Secondary | ICD-10-CM | POA: Diagnosis not present

## 2017-12-30 DIAGNOSIS — H25013 Cortical age-related cataract, bilateral: Secondary | ICD-10-CM | POA: Diagnosis not present

## 2017-12-30 DIAGNOSIS — H2513 Age-related nuclear cataract, bilateral: Secondary | ICD-10-CM | POA: Diagnosis not present

## 2018-01-04 DIAGNOSIS — R69 Illness, unspecified: Secondary | ICD-10-CM | POA: Diagnosis not present

## 2018-01-06 DIAGNOSIS — H2511 Age-related nuclear cataract, right eye: Secondary | ICD-10-CM | POA: Diagnosis not present

## 2018-01-06 DIAGNOSIS — H25012 Cortical age-related cataract, left eye: Secondary | ICD-10-CM | POA: Diagnosis not present

## 2018-01-06 DIAGNOSIS — H2512 Age-related nuclear cataract, left eye: Secondary | ICD-10-CM | POA: Diagnosis not present

## 2018-01-06 DIAGNOSIS — H25011 Cortical age-related cataract, right eye: Secondary | ICD-10-CM | POA: Diagnosis not present

## 2018-01-20 DIAGNOSIS — H25011 Cortical age-related cataract, right eye: Secondary | ICD-10-CM | POA: Diagnosis not present

## 2018-01-20 DIAGNOSIS — H2511 Age-related nuclear cataract, right eye: Secondary | ICD-10-CM | POA: Diagnosis not present

## 2018-06-23 DIAGNOSIS — R69 Illness, unspecified: Secondary | ICD-10-CM | POA: Diagnosis not present

## 2018-06-23 DIAGNOSIS — D72819 Decreased white blood cell count, unspecified: Secondary | ICD-10-CM | POA: Diagnosis not present

## 2018-06-23 DIAGNOSIS — R300 Dysuria: Secondary | ICD-10-CM | POA: Diagnosis not present

## 2018-06-23 DIAGNOSIS — R072 Precordial pain: Secondary | ICD-10-CM | POA: Diagnosis not present

## 2018-06-23 DIAGNOSIS — R946 Abnormal results of thyroid function studies: Secondary | ICD-10-CM | POA: Diagnosis not present

## 2018-06-23 DIAGNOSIS — Z8639 Personal history of other endocrine, nutritional and metabolic disease: Secondary | ICD-10-CM | POA: Diagnosis not present

## 2018-06-23 DIAGNOSIS — Z0000 Encounter for general adult medical examination without abnormal findings: Secondary | ICD-10-CM | POA: Diagnosis not present

## 2018-06-23 DIAGNOSIS — G4733 Obstructive sleep apnea (adult) (pediatric): Secondary | ICD-10-CM | POA: Diagnosis not present

## 2018-06-23 DIAGNOSIS — Z853 Personal history of malignant neoplasm of breast: Secondary | ICD-10-CM | POA: Diagnosis not present

## 2018-10-28 DIAGNOSIS — G4733 Obstructive sleep apnea (adult) (pediatric): Secondary | ICD-10-CM | POA: Diagnosis not present

## 2018-11-25 ENCOUNTER — Other Ambulatory Visit: Payer: Self-pay | Admitting: Internal Medicine

## 2018-11-25 DIAGNOSIS — Z1231 Encounter for screening mammogram for malignant neoplasm of breast: Secondary | ICD-10-CM

## 2018-11-28 DIAGNOSIS — G4733 Obstructive sleep apnea (adult) (pediatric): Secondary | ICD-10-CM | POA: Diagnosis not present

## 2018-12-10 DIAGNOSIS — L570 Actinic keratosis: Secondary | ICD-10-CM | POA: Diagnosis not present

## 2018-12-10 DIAGNOSIS — Z411 Encounter for cosmetic surgery: Secondary | ICD-10-CM | POA: Diagnosis not present

## 2018-12-28 DIAGNOSIS — G4733 Obstructive sleep apnea (adult) (pediatric): Secondary | ICD-10-CM | POA: Diagnosis not present

## 2019-01-04 ENCOUNTER — Other Ambulatory Visit: Payer: Self-pay

## 2019-01-04 ENCOUNTER — Ambulatory Visit
Admission: RE | Admit: 2019-01-04 | Discharge: 2019-01-04 | Disposition: A | Discharge status: Home / Self Care | Payer: Medicare HMO | Source: Ambulatory Visit | Attending: Internal Medicine | Admitting: Internal Medicine

## 2019-01-04 DIAGNOSIS — Z1231 Encounter for screening mammogram for malignant neoplasm of breast: Secondary | ICD-10-CM

## 2019-01-05 DIAGNOSIS — Z23 Encounter for immunization: Secondary | ICD-10-CM | POA: Diagnosis not present

## 2019-01-05 DIAGNOSIS — L720 Epidermal cyst: Secondary | ICD-10-CM | POA: Diagnosis not present

## 2019-01-05 DIAGNOSIS — D2261 Melanocytic nevi of right upper limb, including shoulder: Secondary | ICD-10-CM | POA: Diagnosis not present

## 2019-01-05 DIAGNOSIS — L821 Other seborrheic keratosis: Secondary | ICD-10-CM | POA: Diagnosis not present

## 2019-01-05 DIAGNOSIS — D225 Melanocytic nevi of trunk: Secondary | ICD-10-CM | POA: Diagnosis not present

## 2019-01-05 DIAGNOSIS — D485 Neoplasm of uncertain behavior of skin: Secondary | ICD-10-CM | POA: Diagnosis not present

## 2019-01-11 DIAGNOSIS — H26492 Other secondary cataract, left eye: Secondary | ICD-10-CM | POA: Diagnosis not present

## 2019-01-11 DIAGNOSIS — Z961 Presence of intraocular lens: Secondary | ICD-10-CM | POA: Diagnosis not present

## 2019-01-14 DIAGNOSIS — R69 Illness, unspecified: Secondary | ICD-10-CM | POA: Diagnosis not present

## 2019-01-26 DIAGNOSIS — H26492 Other secondary cataract, left eye: Secondary | ICD-10-CM | POA: Diagnosis not present

## 2019-02-21 DIAGNOSIS — Z03818 Encounter for observation for suspected exposure to other biological agents ruled out: Secondary | ICD-10-CM | POA: Diagnosis not present

## 2019-02-25 DIAGNOSIS — G4733 Obstructive sleep apnea (adult) (pediatric): Secondary | ICD-10-CM | POA: Diagnosis not present

## 2019-03-09 DIAGNOSIS — Z20818 Contact with and (suspected) exposure to other bacterial communicable diseases: Secondary | ICD-10-CM | POA: Diagnosis not present

## 2019-03-10 DIAGNOSIS — Z20818 Contact with and (suspected) exposure to other bacterial communicable diseases: Secondary | ICD-10-CM | POA: Diagnosis not present

## 2019-03-28 DIAGNOSIS — G4733 Obstructive sleep apnea (adult) (pediatric): Secondary | ICD-10-CM | POA: Diagnosis not present

## 2019-04-25 ENCOUNTER — Ambulatory Visit: Payer: Medicare HMO

## 2019-04-26 ENCOUNTER — Ambulatory Visit: Payer: Medicare HMO

## 2019-04-28 DIAGNOSIS — G4733 Obstructive sleep apnea (adult) (pediatric): Secondary | ICD-10-CM | POA: Diagnosis not present

## 2019-05-03 NOTE — Telephone Encounter (Signed)
Error

## 2019-05-11 ENCOUNTER — Ambulatory Visit: Payer: Medicare HMO

## 2019-07-11 ENCOUNTER — Other Ambulatory Visit: Payer: Self-pay | Admitting: Internal Medicine

## 2019-07-11 DIAGNOSIS — Z86010 Personal history of colonic polyps: Secondary | ICD-10-CM | POA: Diagnosis not present

## 2019-07-11 DIAGNOSIS — Z8371 Family history of colonic polyps: Secondary | ICD-10-CM | POA: Diagnosis not present

## 2019-07-11 DIAGNOSIS — Z853 Personal history of malignant neoplasm of breast: Secondary | ICD-10-CM | POA: Diagnosis not present

## 2019-07-11 DIAGNOSIS — G4733 Obstructive sleep apnea (adult) (pediatric): Secondary | ICD-10-CM | POA: Diagnosis not present

## 2019-07-11 DIAGNOSIS — Z0001 Encounter for general adult medical examination with abnormal findings: Secondary | ICD-10-CM | POA: Diagnosis not present

## 2019-07-11 DIAGNOSIS — R946 Abnormal results of thyroid function studies: Secondary | ICD-10-CM | POA: Diagnosis not present

## 2019-07-11 DIAGNOSIS — R69 Illness, unspecified: Secondary | ICD-10-CM | POA: Diagnosis not present

## 2019-07-11 DIAGNOSIS — K5730 Diverticulosis of large intestine without perforation or abscess without bleeding: Secondary | ICD-10-CM | POA: Diagnosis not present

## 2019-07-11 DIAGNOSIS — Z780 Asymptomatic menopausal state: Secondary | ICD-10-CM

## 2019-07-11 DIAGNOSIS — D72819 Decreased white blood cell count, unspecified: Secondary | ICD-10-CM | POA: Diagnosis not present

## 2019-07-11 DIAGNOSIS — M8580 Other specified disorders of bone density and structure, unspecified site: Secondary | ICD-10-CM

## 2019-07-11 DIAGNOSIS — Z1389 Encounter for screening for other disorder: Secondary | ICD-10-CM | POA: Diagnosis not present

## 2019-09-28 ENCOUNTER — Other Ambulatory Visit: Payer: Self-pay

## 2019-09-28 ENCOUNTER — Ambulatory Visit
Admission: RE | Admit: 2019-09-28 | Discharge: 2019-09-28 | Disposition: A | Discharge status: Home / Self Care | Payer: Medicare HMO | Source: Ambulatory Visit | Attending: Internal Medicine | Admitting: Internal Medicine

## 2019-09-28 DIAGNOSIS — M8580 Other specified disorders of bone density and structure, unspecified site: Secondary | ICD-10-CM

## 2019-09-28 DIAGNOSIS — M8589 Other specified disorders of bone density and structure, multiple sites: Secondary | ICD-10-CM | POA: Diagnosis not present

## 2019-09-28 DIAGNOSIS — Z780 Asymptomatic menopausal state: Secondary | ICD-10-CM

## 2019-11-07 DIAGNOSIS — Z20822 Contact with and (suspected) exposure to covid-19: Secondary | ICD-10-CM | POA: Diagnosis not present

## 2019-11-19 IMAGING — MG DIGITAL SCREENING BILAT W/ TOMO W/ CAD
8 series · 9 of 24 positions shown · non-contrast
Comparison: Previous exam(s).

CLINICAL DATA: Screening.

EXAM:
DIGITAL SCREENING BILATERAL MAMMOGRAM WITH TOMO AND CAD

[L CC synth-2D]
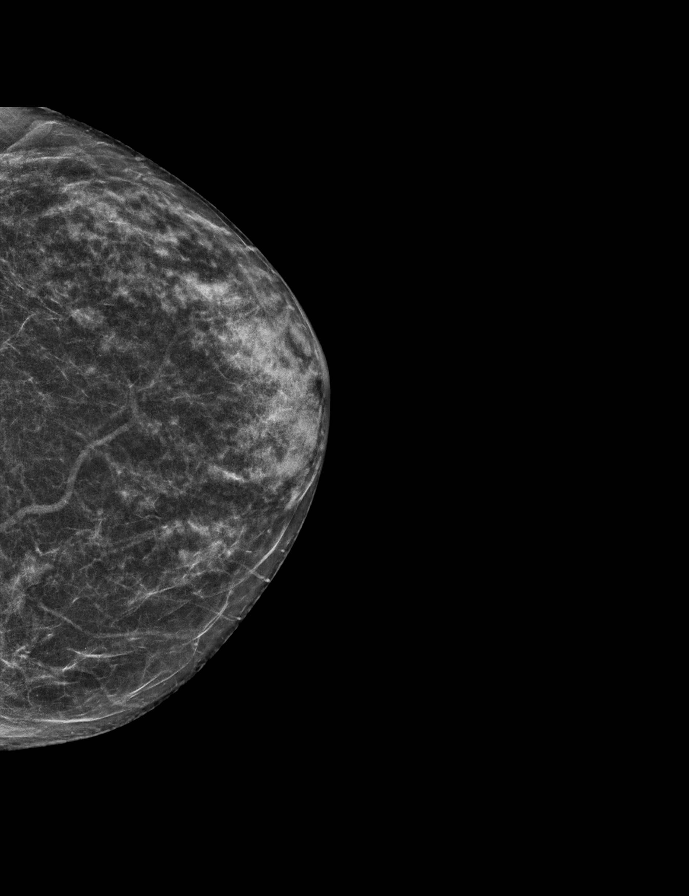

[R MLO synth-2D]
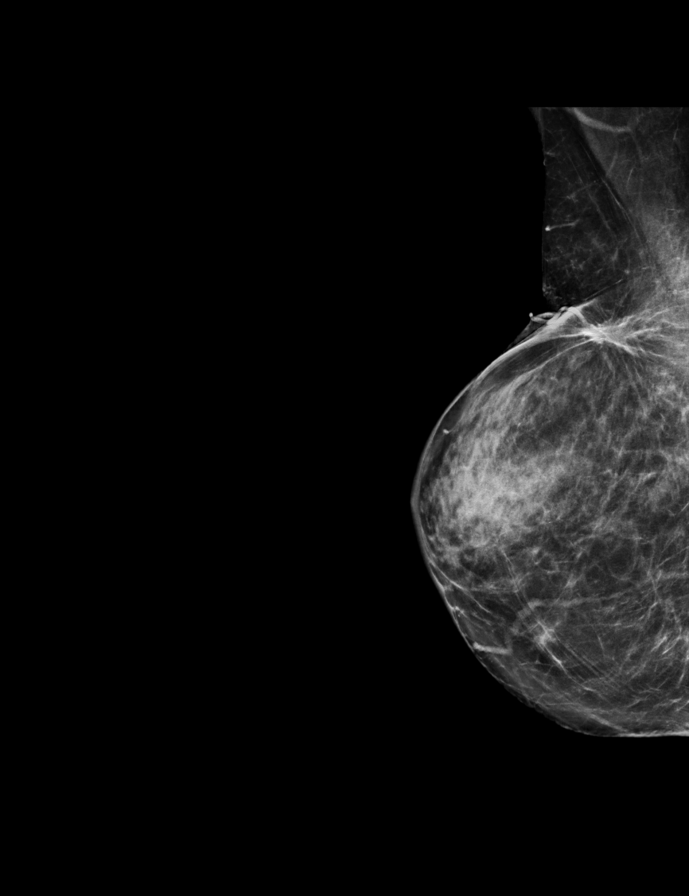

[R CC synth-2D]
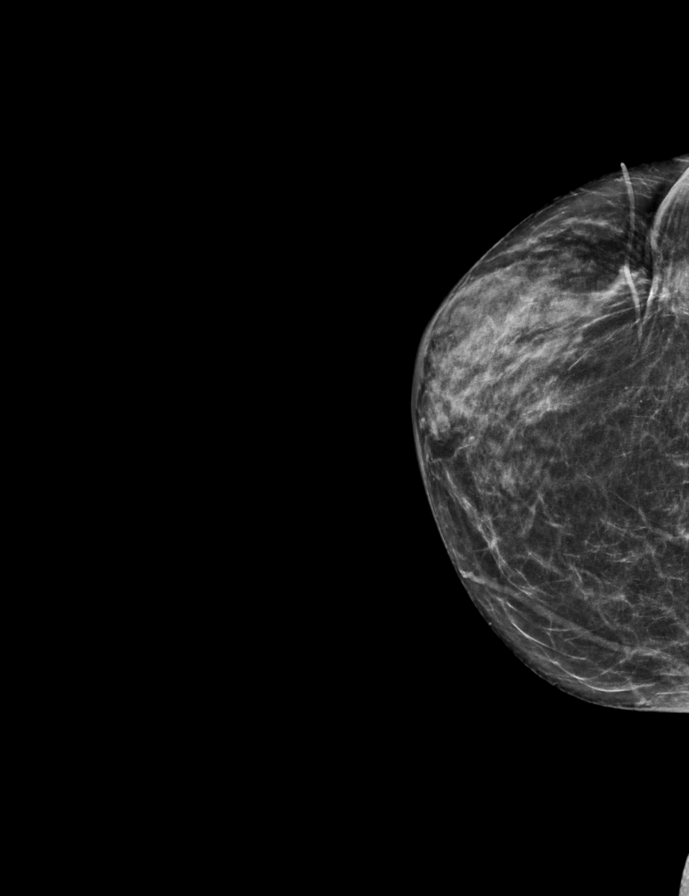

[L MLO synth-2D]
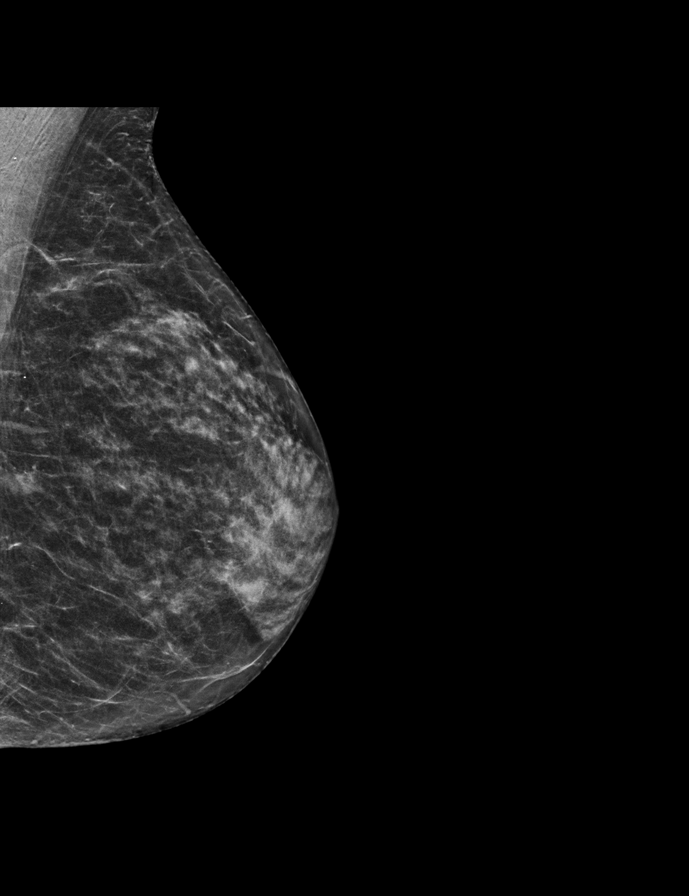

[R MLO tomo · 2 of 59 frames shown]
[frame 20/59]
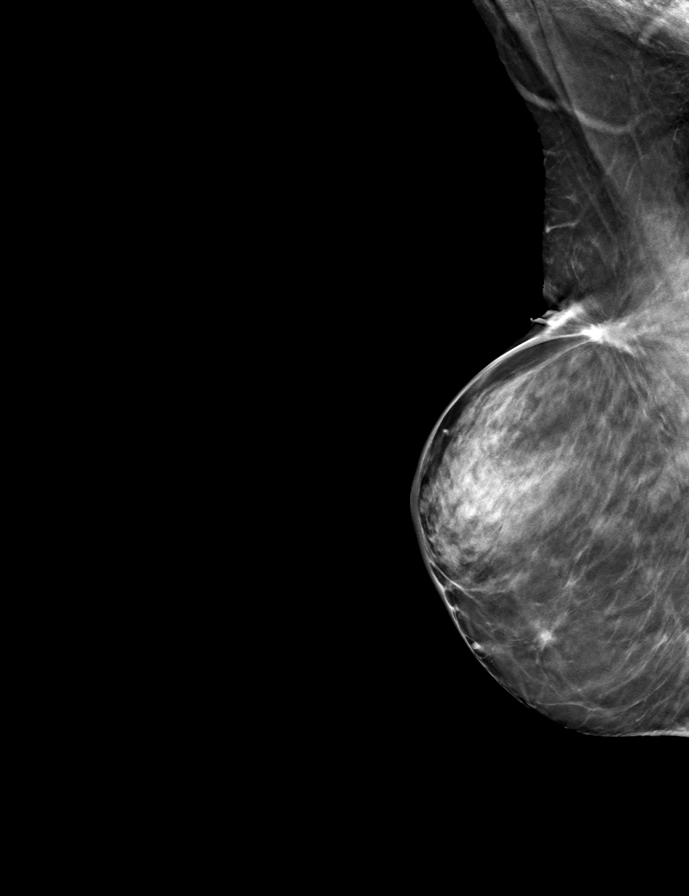
[frame 30/59]
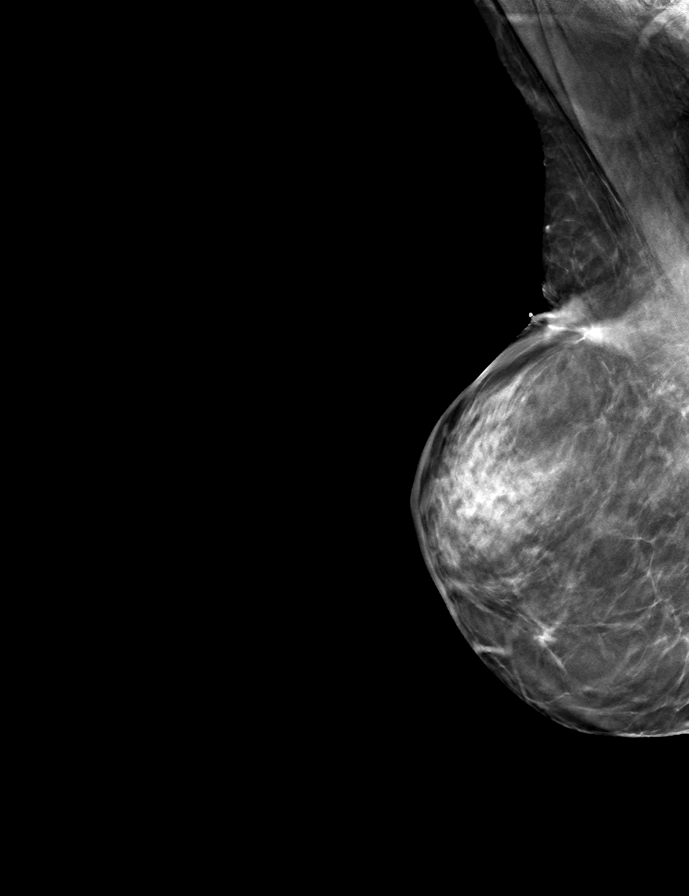

[R CC tomo · tomo slice 26/51.0]
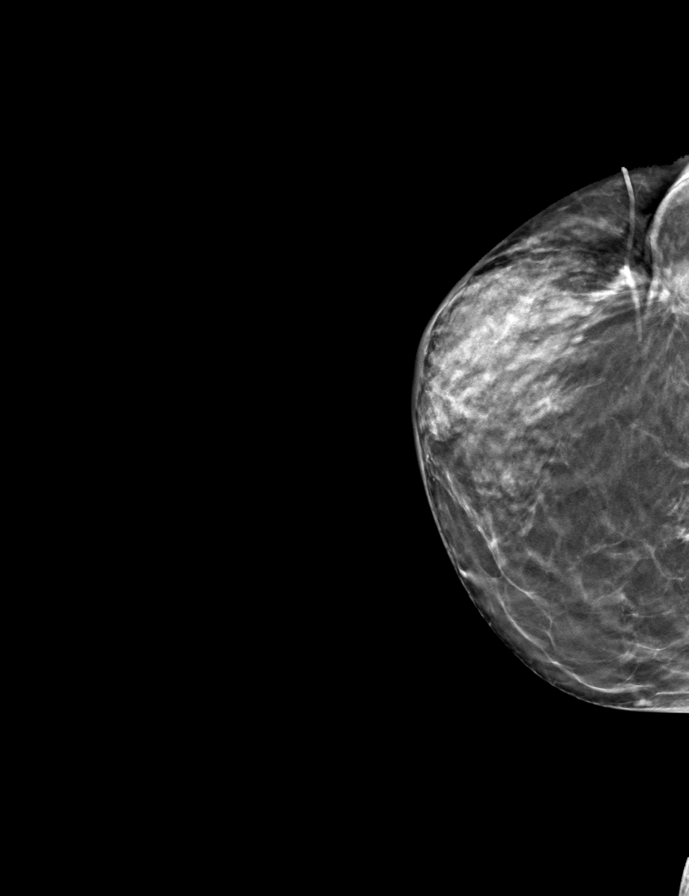

[L MLO tomo · tomo slice 27/54.0]
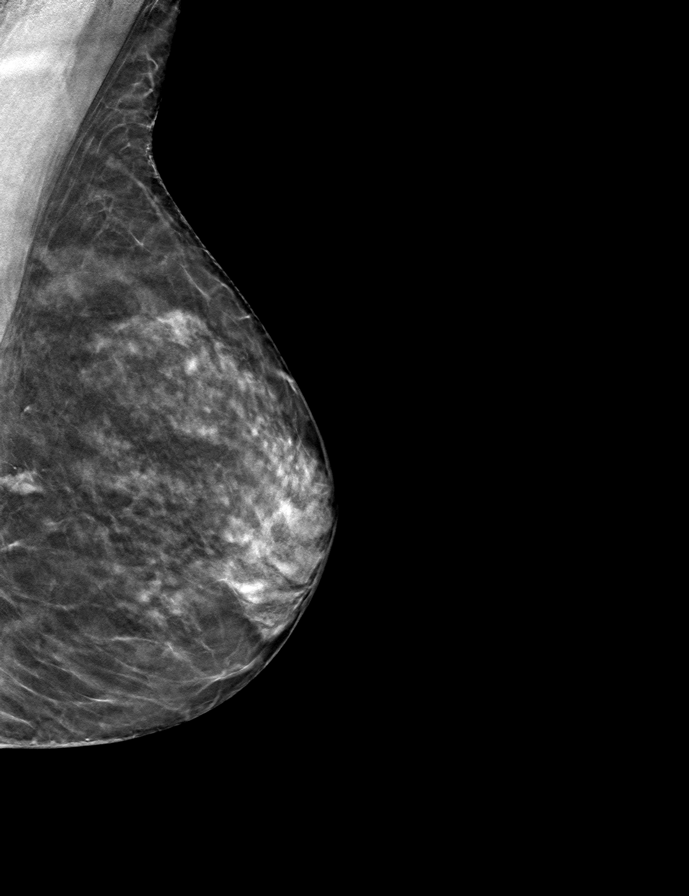

[L CC tomo · tomo slice 25/50.0]
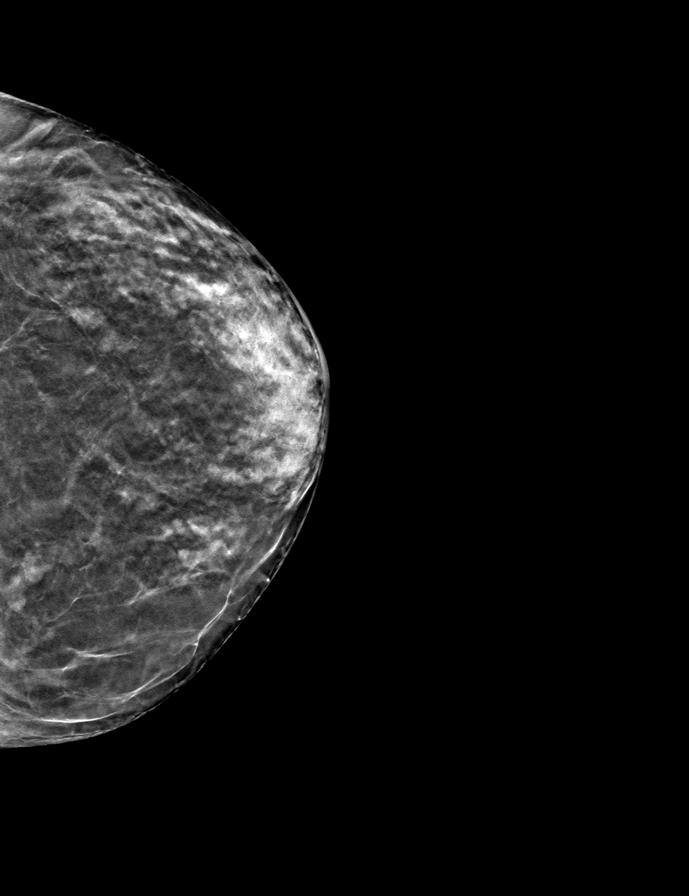

[9 of 24 positions shown; findings below may reference images not displayed]

ACR Breast Density Category c: The breast tissue is heterogeneously
dense, which may obscure small masses.
FINDINGS: There are no findings suspicious for malignancy. Images were
processed with CAD.
IMPRESSION: No mammographic evidence of malignancy. A result letter of this
screening mammogram will be mailed directly to the patient.

RECOMMENDATION:
Screening mammogram in one year. (Code:[id number])

BI-RADS CATEGORY  1: Negative.

## 2019-11-21 ENCOUNTER — Other Ambulatory Visit: Payer: Self-pay | Admitting: Internal Medicine

## 2019-11-21 DIAGNOSIS — Z1231 Encounter for screening mammogram for malignant neoplasm of breast: Secondary | ICD-10-CM

## 2019-12-13 DIAGNOSIS — R69 Illness, unspecified: Secondary | ICD-10-CM | POA: Diagnosis not present

## 2020-01-09 ENCOUNTER — Ambulatory Visit: Payer: Medicare HMO

## 2020-01-19 DIAGNOSIS — D485 Neoplasm of uncertain behavior of skin: Secondary | ICD-10-CM | POA: Diagnosis not present

## 2020-01-19 DIAGNOSIS — C44311 Basal cell carcinoma of skin of nose: Secondary | ICD-10-CM | POA: Diagnosis not present

## 2020-01-19 DIAGNOSIS — L821 Other seborrheic keratosis: Secondary | ICD-10-CM | POA: Diagnosis not present

## 2020-01-19 DIAGNOSIS — L578 Other skin changes due to chronic exposure to nonionizing radiation: Secondary | ICD-10-CM | POA: Diagnosis not present

## 2020-01-19 DIAGNOSIS — C44511 Basal cell carcinoma of skin of breast: Secondary | ICD-10-CM | POA: Diagnosis not present

## 2020-01-19 DIAGNOSIS — D225 Melanocytic nevi of trunk: Secondary | ICD-10-CM | POA: Diagnosis not present

## 2020-01-23 DIAGNOSIS — G4733 Obstructive sleep apnea (adult) (pediatric): Secondary | ICD-10-CM | POA: Diagnosis not present

## 2020-01-24 ENCOUNTER — Ambulatory Visit
Admission: RE | Admit: 2020-01-24 | Discharge: 2020-01-24 | Disposition: A | Discharge status: Home / Self Care | Payer: Medicare HMO | Source: Ambulatory Visit | Attending: Internal Medicine | Admitting: Internal Medicine

## 2020-01-24 ENCOUNTER — Other Ambulatory Visit: Payer: Self-pay

## 2020-01-24 DIAGNOSIS — Z1231 Encounter for screening mammogram for malignant neoplasm of breast: Secondary | ICD-10-CM

## 2020-02-03 ENCOUNTER — Ambulatory Visit: Payer: Medicare HMO | Attending: Internal Medicine

## 2020-02-03 DIAGNOSIS — Z23 Encounter for immunization: Secondary | ICD-10-CM

## 2020-02-03 NOTE — Progress Notes (Signed)
   Covid-19 Vaccination Clinic  Name:  Heather Mercado    MRN: [id number] DOB: 11-12-1950  02/03/2020  [name] was observed post Covid-19 immunization for 15 minutes without incident. She was provided with Vaccine Information Sheet and instruction to access the V-Safe system.   [name] was instructed to call 911 with any severe reactions post vaccine: Marland Kitchen Difficulty breathing  . Swelling of face and throat  . A fast heartbeat  . A bad rash all over body  . Dizziness and weakness

## 2020-02-22 DIAGNOSIS — G4733 Obstructive sleep apnea (adult) (pediatric): Secondary | ICD-10-CM | POA: Diagnosis not present

## 2020-02-28 DIAGNOSIS — L988 Other specified disorders of the skin and subcutaneous tissue: Secondary | ICD-10-CM | POA: Diagnosis not present

## 2020-02-28 DIAGNOSIS — C44511 Basal cell carcinoma of skin of breast: Secondary | ICD-10-CM | POA: Diagnosis not present

## 2020-03-24 DIAGNOSIS — G4733 Obstructive sleep apnea (adult) (pediatric): Secondary | ICD-10-CM | POA: Diagnosis not present

## 2020-04-26 DIAGNOSIS — C44311 Basal cell carcinoma of skin of nose: Secondary | ICD-10-CM | POA: Diagnosis not present

## 2020-06-14 DIAGNOSIS — G4733 Obstructive sleep apnea (adult) (pediatric): Secondary | ICD-10-CM | POA: Diagnosis not present

## 2020-07-15 DIAGNOSIS — G4733 Obstructive sleep apnea (adult) (pediatric): Secondary | ICD-10-CM | POA: Diagnosis not present

## 2020-07-18 DIAGNOSIS — Z961 Presence of intraocular lens: Secondary | ICD-10-CM | POA: Diagnosis not present

## 2020-07-18 DIAGNOSIS — H524 Presbyopia: Secondary | ICD-10-CM | POA: Diagnosis not present

## 2020-08-08 ENCOUNTER — Ambulatory Visit: Payer: Self-pay | Admitting: Nurse Practitioner

## 2020-08-14 ENCOUNTER — Ambulatory Visit: Payer: Self-pay | Admitting: Nurse Practitioner

## 2020-08-14 DIAGNOSIS — L570 Actinic keratosis: Secondary | ICD-10-CM | POA: Diagnosis not present

## 2020-08-14 DIAGNOSIS — G4733 Obstructive sleep apnea (adult) (pediatric): Secondary | ICD-10-CM | POA: Diagnosis not present

## 2020-08-14 DIAGNOSIS — L578 Other skin changes due to chronic exposure to nonionizing radiation: Secondary | ICD-10-CM | POA: Diagnosis not present

## 2020-08-29 ENCOUNTER — Encounter: Payer: Self-pay | Admitting: Nurse Practitioner

## 2020-08-30 ENCOUNTER — Other Ambulatory Visit: Payer: Self-pay

## 2020-08-30 ENCOUNTER — Other Ambulatory Visit (HOSPITAL_COMMUNITY)
Admission: RE | Admit: 2020-08-30 | Discharge: 2020-08-30 | Disposition: A | Discharge status: Home / Self Care | Payer: Medicare HMO | Source: Ambulatory Visit | Attending: Nurse Practitioner | Admitting: Nurse Practitioner

## 2020-08-30 ENCOUNTER — Ambulatory Visit (INDEPENDENT_AMBULATORY_CARE_PROVIDER_SITE_OTHER): Payer: Medicare HMO | Admitting: Nurse Practitioner

## 2020-08-30 ENCOUNTER — Encounter: Payer: Self-pay | Admitting: Nurse Practitioner

## 2020-08-30 VITALS — BP 118/78 | Ht 64.5 in | Wt 129.0 lb

## 2020-08-30 DIAGNOSIS — Z780 Asymptomatic menopausal state: Secondary | ICD-10-CM

## 2020-08-30 DIAGNOSIS — Z1151 Encounter for screening for human papillomavirus (HPV): Secondary | ICD-10-CM | POA: Insufficient documentation

## 2020-08-30 DIAGNOSIS — Z01419 Encounter for gynecological examination (general) (routine) without abnormal findings: Secondary | ICD-10-CM | POA: Diagnosis not present

## 2020-08-30 DIAGNOSIS — R69 Illness, unspecified: Secondary | ICD-10-CM | POA: Diagnosis not present

## 2020-08-30 DIAGNOSIS — M8589 Other specified disorders of bone density and structure, multiple sites: Secondary | ICD-10-CM

## 2020-08-30 DIAGNOSIS — Z124 Encounter for screening for malignant neoplasm of cervix: Secondary | ICD-10-CM

## 2020-08-30 DIAGNOSIS — Z853 Personal history of malignant neoplasm of breast: Secondary | ICD-10-CM

## 2020-08-30 NOTE — Progress Notes (Signed)
   Heather Mercado November 02, 1950 [id number]   History:  70 y.o. G2P2 presents for breast and pelvic exam without GYN complaints. Last seen at our office in 2016. Postmenopausal - no HRT, no bleeding. Normal pap and mammogram history. 2003 right breast cancer managed with lumpectomy, radiation, and chemo.   Gynecologic History No LMP recorded. Patient is postmenopausal.   Contraception: post menopausal status  Health Maintenance Last Pap: 2015. Results were: normal Last mammogram: 01/24/2020. Results were: normal Last colonoscopy: 2019. Results were: polyps, 5-year recall Last Dexa: 09/28/2019. Results were: T-score -2.1, FRAX 7.9 % / 0.8%  Past medical history, past surgical history, family history and social history were all reviewed and documented in the EPIC chart. Married. 1 stepson in [place], 2 sons in [place]. 3 grandchildren with 1 on the way.  ROS:  A ROS was performed and pertinent positives and negatives are included.  Exam:  Vitals:   08/30/20 1444  BP: 118/78  Weight: 129 lb (58.5 kg)  Height: 5' 4.5" (1.638 m)   Body mass index is 21.8 kg/m.  General appearance:  Normal Thyroid:  Symmetrical, normal in size, without palpable masses or nodularity. Respiratory  Auscultation:  Clear without wheezing or rhonchi Cardiovascular  Auscultation:  Regular rate, without rubs, murmurs or gallops  Edema/varicosities:  Not grossly evident Abdominal  Soft,nontender, without masses, guarding or rebound.  Liver/spleen:  No organomegaly noted  Hernia:  None appreciated  Skin  Inspection:  Grossly normal Breasts: Examined lying and sitting.   Right: Without masses, retractions, nipple discharge or axillary adenopathy.   Left: Without masses, retractions, nipple discharge or axillary adenopathy. Genitourinary   Inguinal/mons:  Normal without inguinal adenopathy  External genitalia:  Normal appearing vulva with no masses, tenderness, or lesions  BUS/Urethra/Skene's glands:   Normal  Vagina:  Normal appearing with normal color and discharge, no lesions. Atrophic changes.   Cervix:  Normal appearing without discharge or lesions  Uterus:  Normal in size, shape and contour.  Midline and mobile, nontender  Adnexa/parametria:     Rt: Normal in size, without masses or tenderness.   Lt: Normal in size, without masses or tenderness.  Anus and perineum: Normal  Digital rectal exam: Normal sphincter tone without palpated masses or tenderness  Assessment/Plan:  70 y.o. G2P2 for breast and pelvic exam.   Well female exam with routine gynecological exam - Plan: Cytology - PAP( [place]). Education provided on SBEs, importance of preventative screenings, current guidelines, high calcium diet, regular exercise, and multivitamin daily. Labs with PCP.   Postmenopausal - no HRT, no bleeding.   History of right breast cancer - 2003 managed by lumpectomy, radiation, and chemotherapy. Normal breast exam today.   Osteopenia of multiple sites - Dexa 09/28/2019 T-score -2.1, FRAX 7.9 % / 0.8%. Continue daily Vitamin D supplement and regular exercise.   Screening for cervical cancer - Normal Pap history.  Pap with HR HPV today. If normal, we discussed current guidelines and the option to stop screenings. She is agreeable.   Screening for breast cancer - Normal mammogram history.  Continue annual screenings.  Normal breast exam today.  Screening for colon cancer - 2019 colonoscopy. Will repeat at GI's recommended interval.   Return in 1 year for breast and pelvic exam.    Tamela Gammon DNP, 3:02 PM 08/30/2020

## 2020-08-31 LAB — CYTOLOGY - PAP
Comment: NEGATIVE
Diagnosis: NEGATIVE
High risk HPV: NEGATIVE

## 2020-09-03 DIAGNOSIS — R197 Diarrhea, unspecified: Secondary | ICD-10-CM | POA: Diagnosis not present

## 2020-09-03 DIAGNOSIS — J320 Chronic maxillary sinusitis: Secondary | ICD-10-CM | POA: Diagnosis not present

## 2020-10-26 DIAGNOSIS — G4733 Obstructive sleep apnea (adult) (pediatric): Secondary | ICD-10-CM | POA: Diagnosis not present

## 2020-11-12 DIAGNOSIS — J3489 Other specified disorders of nose and nasal sinuses: Secondary | ICD-10-CM | POA: Diagnosis not present

## 2020-11-12 DIAGNOSIS — J343 Hypertrophy of nasal turbinates: Secondary | ICD-10-CM | POA: Diagnosis not present

## 2020-12-04 ENCOUNTER — Other Ambulatory Visit: Payer: Self-pay | Admitting: *Deleted

## 2020-12-05 ENCOUNTER — Other Ambulatory Visit: Payer: Self-pay

## 2020-12-05 ENCOUNTER — Inpatient Hospital Stay (HOSPITAL_BASED_OUTPATIENT_CLINIC_OR_DEPARTMENT_OTHER): Payer: Medicare HMO | Admitting: Oncology

## 2020-12-05 ENCOUNTER — Inpatient Hospital Stay: Payer: Medicare HMO | Attending: Oncology

## 2020-12-05 ENCOUNTER — Other Ambulatory Visit: Payer: Self-pay | Admitting: *Deleted

## 2020-12-05 DIAGNOSIS — Z853 Personal history of malignant neoplasm of breast: Secondary | ICD-10-CM

## 2020-12-05 DIAGNOSIS — C50911 Malignant neoplasm of unspecified site of right female breast: Secondary | ICD-10-CM | POA: Diagnosis not present

## 2020-12-05 DIAGNOSIS — Z9221 Personal history of antineoplastic chemotherapy: Secondary | ICD-10-CM | POA: Diagnosis not present

## 2020-12-05 DIAGNOSIS — Z87891 Personal history of nicotine dependence: Secondary | ICD-10-CM | POA: Diagnosis not present

## 2020-12-05 DIAGNOSIS — Z923 Personal history of irradiation: Secondary | ICD-10-CM | POA: Diagnosis not present

## 2020-12-05 DIAGNOSIS — Z803 Family history of malignant neoplasm of breast: Secondary | ICD-10-CM | POA: Insufficient documentation

## 2020-12-05 DIAGNOSIS — M8580 Other specified disorders of bone density and structure, unspecified site: Secondary | ICD-10-CM | POA: Diagnosis not present

## 2020-12-05 LAB — CBC WITH DIFFERENTIAL (CANCER CENTER ONLY)
Abs Immature Granulocytes: 0 10*3/uL (ref 0.00–0.07)
Basophils Absolute: 0 10*3/uL (ref 0.0–0.1)
Basophils Relative: 1 %
Eosinophils Absolute: 0.1 10*3/uL (ref 0.0–0.5)
Eosinophils Relative: 2 %
HCT: 42.4 % (ref 36.0–46.0)
Hemoglobin: 14.1 g/dL (ref 12.0–15.0)
Immature Granulocytes: 0 %
Lymphocytes Relative: 39 %
Lymphs Abs: 1.7 10*3/uL (ref 0.7–4.0)
MCH: 29.5 pg (ref 26.0–34.0)
MCHC: 33.3 g/dL (ref 30.0–36.0)
MCV: 88.7 fL (ref 80.0–100.0)
Monocytes Absolute: 0.3 10*3/uL (ref 0.1–1.0)
Monocytes Relative: 8 %
Neutro Abs: 2.2 10*3/uL (ref 1.7–7.7)
Neutrophils Relative %: 50 %
Platelet Count: 167 10*3/uL (ref 150–400)
RBC: 4.78 MIL/uL (ref 3.87–5.11)
RDW: 14.2 % (ref 11.5–15.5)
WBC Count: 4.3 10*3/uL (ref 4.0–10.5)
nRBC: 0 % (ref 0.0–0.2)

## 2020-12-05 LAB — CMP (CANCER CENTER ONLY)
ALT: 24 U/L (ref 0–44)
AST: 23 U/L (ref 15–41)
Albumin: 3.9 g/dL (ref 3.5–5.0)
Alkaline Phosphatase: 49 U/L (ref 38–126)
Anion gap: 10 (ref 5–15)
BUN: 13 mg/dL (ref 8–23)
CO2: 26 mmol/L (ref 22–32)
Calcium: 9.5 mg/dL (ref 8.9–10.3)
Chloride: 105 mmol/L (ref 98–111)
Creatinine: 0.79 mg/dL (ref 0.44–1.00)
GFR, Estimated: >60 mL/min (ref >60)
Glucose, Bld: 90 mg/dL (ref 70–99)
Potassium: 4.3 mmol/L (ref 3.5–5.1)
Sodium: 141 mmol/L (ref 135–145)
Total Bilirubin: 0.7 mg/dL (ref 0.3–1.2)
Total Protein: 6.9 g/dL (ref 6.5–8.1)

## 2020-12-05 NOTE — Progress Notes (Signed)
ID: Heather Mercado   DOB: 1951/01/18  MR#: [id number]  [id number]  Patient Care Team: Josetta Huddle, MD as PCP - General (Internal Medicine) OTHER MD:   CHIEF COMPLAINT: h/o breast cancer  CURRENT TREATMENT: observation   INTERVAL HISTORY: Heather Mercado returns today for followup of her history of breast cancer. She continues under observation.  The interval history is unremarkable.  Mammography at the [facility] 01/24/2020 findings breast density category C and no evidence of disease recurrence.   REVIEW OF SYSTEMS: Heather Mercado exercises regularly doing Pilates, golfing, and yard work.  She had COVID July 2022, chiefly with fatigue as the main symptom.  She did take back Slo-Bid for this.  There have been no residual issues from that.   COVID 19 VACCINATION STATUS: Moderna x3, status post COVID July 2022  PAST MEDICAL HISTORY: Past Medical History:  Diagnosis Date   History of breast cancer 12/25/2011   Right   History of radiation therapy    Osteopenia 02/2012   T score -1.9 stable from prior DEXA   Personal history of chemotherapy 05/2001   Personal history of radiation therapy 07/2001   Sleep apnea     PAST SURGICAL HISTORY: Past Surgical History:  Procedure Laterality Date   ACHILLES TENDON REPAIR  1999   BREAST BIOPSY Right 04/19/2001   BREAST LUMPECTOMY Right 04/27/2001   right   [place] lids   DILATION AND CURETTAGE OF UTERUS  2004   endometrial polyp    FAMILY HISTORY Family History  Problem Relation Age of Onset   Breast cancer Mother 46   Heart disease Father    Colon cancer Maternal Aunt    Lung cancer Maternal Uncle    the patient's mother and a maternal great aunt were diagnosed with postmenopausal breast cancer.   GYNECOLOGIC HISTORY: Menarche age 63, first live birth age 45. She is GXP3. She entered menopause in the course of chemotherapy.   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 Her  husband Heather Mercado is in real estate. Son Heather Mercado works at [employer] as an Social worker. Heather Mercado passed the [employer] bar exam February 2014, and got married shortly thereafter. Son Heather Mercado attended [employer].   ADVANCED DIRECTIVES: In place   HEALTH MAINTENANCE: Social History   Tobacco Use   Smoking status: Former    Packs/day: 0.50    Years: 10.00    Pack years: 5.00    Types: Cigarettes    Quit date: 03/24/1982    Years since quitting: 38.7   Smokeless tobacco: Never  Substance Use Topics   Alcohol use: Yes    Alcohol/week: 4.0 standard drinks    Types: 4 Standard drinks or equivalent per week    Comment: 4-5 glasses per week   Drug use: No     Colonoscopy:  PAP:  Bone density: December 2013 showed osteopenia with a T score of -1.9  Lipid panel:  Allergies  Allergen Reactions   Sulfa Antibiotics Rash    Current Outpatient Medications  Medication Sig Dispense Refill   BIOTIN PO Take by mouth.     fish oil-omega-3 fatty acids 1000 MG capsule Take 1 g by mouth daily.       fluticasone (FLONASE) 50 MCG/ACT nasal spray Place 2 sprays into both nostrils daily for 10 days. 16 g 0   glucosamine-chondroitin 500-400 MG tablet Take 1 tablet by mouth 3 (three) times daily.  MAGNESIUM PO Take by mouth as needed.     Multiple Vitamin (MULTIVITAMIN) capsule Take 1 capsule by mouth daily.       Probiotic Product (PROBIOTIC PO) Take by mouth.     No current facility-administered medications for this visit.    OBJECTIVE: white woman who appears younger than stated age There were no vitals filed for this visit.    There is no height or weight on file to calculate BMI.    ECOG FS: 0  Sclerae unicteric, EOMs intact Wearing a mask No cervical or supraclavicular adenopathy Lungs no rales or rhonchi Heart regular rate and rhythm Abd soft, nontender, positive bowel sounds MSK no focal spinal tenderness, no upper extremity lymphedema Neuro:  nonfocal, well oriented, appropriate affect Breasts: The right breast is status postlumpectomy and radiation.  There is no evidence of local recurrence.  The left breast and both axillae are benign   LAB RESULTS: Lab Results  Component Value Date   WBC 4.3 12/05/2020   NEUTROABS 2.2 12/05/2020   HGB 14.1 12/05/2020   HCT 42.4 12/05/2020   MCV 88.7 12/05/2020   PLT 167 12/05/2020      Chemistry      Component Value Date/Time   NA 141 12/05/2020 1546   NA 141 06/09/2014 1312   K 4.3 12/05/2020 1546   K 3.9 06/09/2014 1312   CL 105 12/05/2020 1546   CL 105 03/02/2012 0957   CO2 26 12/05/2020 1546   CO2 25 06/09/2014 1312   BUN 13 12/05/2020 1546   BUN 17.4 06/09/2014 1312   CREATININE 0.79 12/05/2020 1546   CREATININE 0.7 06/09/2014 1312      Component Value Date/Time   CALCIUM 9.5 12/05/2020 1546   CALCIUM 8.9 06/09/2014 1312   ALKPHOS 49 12/05/2020 1546   ALKPHOS 36 (L) 06/09/2014 1312   AST 23 12/05/2020 1546   AST 25 06/09/2014 1312   ALT 24 12/05/2020 1546   ALT 24 06/09/2014 1312   BILITOT 0.7 12/05/2020 1546   BILITOT 0.57 06/09/2014 1312       Lab Results  Component Value Date   LABCA2 12 03/02/2012    No components found for: [id number]  No results for input(s): INR in the last 168 hours.  Urinalysis    Component Value Date/Time   COLORURINE YELLOW 05/01/2014 1140    STUDIES: No results found.    ASSESSMENT: 69 y.o. status post right lumpectomy and sentinel lymph node biopsy February of 2003 for a T1c N1(mic), Stage IB invasive ductal carcinoma, status post cyclophosphamide and doxorubicin dose dense therapy x4 followed by radiation, then on tamoxifen for 1 year with poor tolerance, on exemestane between May of 2005 and May of 2010, at which point she switched to raloxifene, discontinued 2015.Marland Kitchen    PLAN: Heather Mercado is coming up on 20 years from definitive surgery for her breast cancer.  There is no evidence of disease recurrence or activity.  This is  very favorable.  She has excellent follow-up through her primary care physicians.  We are making no further routine appointments for her here but we will be glad to see her again at any point in the future if and when the need arises  Total encounter time 20 minutes.Heather Jews C. Birdie Fetty MD Oncology and Hematology [facility] [place], [place] [address] Tel. [phone]  Joylene Igo [phone]   I, Heather Mercado, am acting as scribe for Dr. Sarajane Jews C. Hatice Bubel.  Joylene Grapes Isacc Turney  MD, have reviewed the above documentation for accuracy and completeness, and I agree with the above.    *Total Encounter Time as defined by the Centers for Medicare and Medicaid Services includes, in addition to the face-to-face time of a patient visit (documented in the note above) non-face-to-face time: obtaining and reviewing outside history, ordering and reviewing medications, tests or procedures, care coordination (communications with other health care professionals or caregivers) and documentation in the medical record.

## 2020-12-19 ENCOUNTER — Other Ambulatory Visit: Payer: Medicare HMO

## 2021-01-22 DIAGNOSIS — Z961 Presence of intraocular lens: Secondary | ICD-10-CM | POA: Diagnosis not present

## 2021-01-22 DIAGNOSIS — H00022 Hordeolum internum right lower eyelid: Secondary | ICD-10-CM | POA: Diagnosis not present

## 2021-02-01 ENCOUNTER — Other Ambulatory Visit: Payer: Self-pay | Admitting: Internal Medicine

## 2021-02-01 DIAGNOSIS — Z1231 Encounter for screening mammogram for malignant neoplasm of breast: Secondary | ICD-10-CM

## 2021-03-01 DIAGNOSIS — R509 Fever, unspecified: Secondary | ICD-10-CM | POA: Diagnosis not present

## 2021-03-01 DIAGNOSIS — Z03818 Encounter for observation for suspected exposure to other biological agents ruled out: Secondary | ICD-10-CM | POA: Diagnosis not present

## 2021-03-01 DIAGNOSIS — R051 Acute cough: Secondary | ICD-10-CM | POA: Diagnosis not present

## 2021-03-01 DIAGNOSIS — R0981 Nasal congestion: Secondary | ICD-10-CM | POA: Diagnosis not present

## 2021-03-06 ENCOUNTER — Ambulatory Visit
Admission: RE | Admit: 2021-03-06 | Discharge: 2021-03-06 | Disposition: A | Discharge status: Home / Self Care | Payer: Medicare HMO | Source: Ambulatory Visit | Attending: Internal Medicine | Admitting: Internal Medicine

## 2021-03-06 ENCOUNTER — Other Ambulatory Visit: Payer: Self-pay

## 2021-03-06 DIAGNOSIS — Z1231 Encounter for screening mammogram for malignant neoplasm of breast: Secondary | ICD-10-CM

## 2021-06-14 DIAGNOSIS — H01119 Allergic dermatitis of unspecified eye, unspecified eyelid: Secondary | ICD-10-CM | POA: Diagnosis not present

## 2021-06-14 DIAGNOSIS — D225 Melanocytic nevi of trunk: Secondary | ICD-10-CM | POA: Diagnosis not present

## 2021-06-14 DIAGNOSIS — L821 Other seborrheic keratosis: Secondary | ICD-10-CM | POA: Diagnosis not present

## 2021-06-14 DIAGNOSIS — L570 Actinic keratosis: Secondary | ICD-10-CM | POA: Diagnosis not present

## 2021-06-14 DIAGNOSIS — L578 Other skin changes due to chronic exposure to nonionizing radiation: Secondary | ICD-10-CM | POA: Diagnosis not present

## 2021-06-14 DIAGNOSIS — Z85828 Personal history of other malignant neoplasm of skin: Secondary | ICD-10-CM | POA: Diagnosis not present

## 2021-06-14 DIAGNOSIS — D485 Neoplasm of uncertain behavior of skin: Secondary | ICD-10-CM | POA: Diagnosis not present

## 2021-07-30 DIAGNOSIS — H524 Presbyopia: Secondary | ICD-10-CM | POA: Diagnosis not present

## 2021-07-30 DIAGNOSIS — Z961 Presence of intraocular lens: Secondary | ICD-10-CM | POA: Diagnosis not present

## 2021-07-30 DIAGNOSIS — H04123 Dry eye syndrome of bilateral lacrimal glands: Secondary | ICD-10-CM | POA: Diagnosis not present

## 2021-08-28 ENCOUNTER — Other Ambulatory Visit: Payer: Self-pay | Admitting: Family Medicine

## 2021-08-28 DIAGNOSIS — K7689 Other specified diseases of liver: Secondary | ICD-10-CM

## 2021-09-03 ENCOUNTER — Ambulatory Visit
Admission: RE | Admit: 2021-09-03 | Discharge: 2021-09-03 | Disposition: A | Discharge status: Home / Self Care | Payer: Medicare HMO | Source: Ambulatory Visit | Attending: Family Medicine | Admitting: Family Medicine

## 2021-09-03 DIAGNOSIS — K7689 Other specified diseases of liver: Secondary | ICD-10-CM

## 2021-09-12 DIAGNOSIS — L719 Rosacea, unspecified: Secondary | ICD-10-CM | POA: Diagnosis not present

## 2021-10-03 DIAGNOSIS — R194 Change in bowel habit: Secondary | ICD-10-CM | POA: Diagnosis not present

## 2021-10-03 DIAGNOSIS — K5730 Diverticulosis of large intestine without perforation or abscess without bleeding: Secondary | ICD-10-CM | POA: Diagnosis not present

## 2021-10-03 DIAGNOSIS — Z86010 Personal history of colonic polyps: Secondary | ICD-10-CM | POA: Diagnosis not present

## 2021-10-03 DIAGNOSIS — K649 Unspecified hemorrhoids: Secondary | ICD-10-CM | POA: Diagnosis not present

## 2021-10-03 DIAGNOSIS — K52832 Lymphocytic colitis: Secondary | ICD-10-CM | POA: Diagnosis not present

## 2021-10-07 DIAGNOSIS — K52832 Lymphocytic colitis: Secondary | ICD-10-CM | POA: Diagnosis not present

## 2022-01-27 ENCOUNTER — Other Ambulatory Visit: Payer: Self-pay | Admitting: Family Medicine

## 2022-01-27 DIAGNOSIS — Z87891 Personal history of nicotine dependence: Secondary | ICD-10-CM

## 2022-02-17 ENCOUNTER — Other Ambulatory Visit: Payer: Self-pay | Admitting: Family Medicine

## 2022-02-17 DIAGNOSIS — Z1231 Encounter for screening mammogram for malignant neoplasm of breast: Secondary | ICD-10-CM

## 2022-04-08 ENCOUNTER — Other Ambulatory Visit: Payer: Self-pay | Admitting: Family Medicine

## 2022-04-08 DIAGNOSIS — Z87891 Personal history of nicotine dependence: Secondary | ICD-10-CM

## 2022-04-11 ENCOUNTER — Ambulatory Visit: Payer: Medicare HMO

## 2022-05-22 ENCOUNTER — Ambulatory Visit
Admission: RE | Admit: 2022-05-22 | Discharge: 2022-05-22 | Disposition: A | Discharge status: Home / Self Care | Payer: No Typology Code available for payment source | Source: Ambulatory Visit | Attending: Family Medicine | Admitting: Family Medicine

## 2022-05-22 DIAGNOSIS — Z1231 Encounter for screening mammogram for malignant neoplasm of breast: Secondary | ICD-10-CM

## 2022-06-10 ENCOUNTER — Other Ambulatory Visit: Payer: Self-pay | Admitting: Family Medicine

## 2022-06-10 DIAGNOSIS — Z87891 Personal history of nicotine dependence: Secondary | ICD-10-CM

## 2022-06-17 DIAGNOSIS — L719 Rosacea, unspecified: Secondary | ICD-10-CM | POA: Diagnosis not present

## 2022-06-17 DIAGNOSIS — D225 Melanocytic nevi of trunk: Secondary | ICD-10-CM | POA: Diagnosis not present

## 2022-06-17 DIAGNOSIS — L821 Other seborrheic keratosis: Secondary | ICD-10-CM | POA: Diagnosis not present

## 2022-06-17 DIAGNOSIS — L578 Other skin changes due to chronic exposure to nonionizing radiation: Secondary | ICD-10-CM | POA: Diagnosis not present

## 2022-06-17 DIAGNOSIS — L570 Actinic keratosis: Secondary | ICD-10-CM | POA: Diagnosis not present

## 2022-06-17 DIAGNOSIS — Z85828 Personal history of other malignant neoplasm of skin: Secondary | ICD-10-CM | POA: Diagnosis not present

## 2022-08-01 ENCOUNTER — Ambulatory Visit (HOSPITAL_COMMUNITY)
Admission: RE | Admit: 2022-08-01 | Discharge: 2022-08-01 | Disposition: A | Discharge status: Home / Self Care | Payer: Medicare HMO | Source: Ambulatory Visit | Attending: Surgery | Admitting: Surgery

## 2022-08-01 ENCOUNTER — Other Ambulatory Visit (HOSPITAL_COMMUNITY): Payer: Self-pay | Admitting: Family Medicine

## 2022-08-01 DIAGNOSIS — Z823 Family history of stroke: Secondary | ICD-10-CM

## 2022-08-07 ENCOUNTER — Encounter (HOSPITAL_COMMUNITY): Payer: Self-pay | Admitting: Cardiology

## 2022-08-13 DIAGNOSIS — Z961 Presence of intraocular lens: Secondary | ICD-10-CM | POA: Diagnosis not present

## 2022-08-13 DIAGNOSIS — H5211 Myopia, right eye: Secondary | ICD-10-CM | POA: Diagnosis not present

## 2022-08-13 DIAGNOSIS — H52201 Unspecified astigmatism, right eye: Secondary | ICD-10-CM | POA: Diagnosis not present

## 2022-08-26 ENCOUNTER — Other Ambulatory Visit: Payer: No Typology Code available for payment source

## 2022-08-27 ENCOUNTER — Telehealth (HOSPITAL_BASED_OUTPATIENT_CLINIC_OR_DEPARTMENT_OTHER): Payer: Self-pay | Admitting: *Deleted

## 2022-08-27 NOTE — Telephone Encounter (Signed)
Patient scheduled 7/22, unable to make 6/25 as she will be out of town  Communicated via [address]

## 2022-08-27 NOTE — Telephone Encounter (Signed)
Received message from Dr Duke Salvia to get patient an appointment  Scheduled patient 09/16/2022 at 10:00 am  Left message to call back, mychart message to patient

## 2022-09-16 ENCOUNTER — Ambulatory Visit (HOSPITAL_BASED_OUTPATIENT_CLINIC_OR_DEPARTMENT_OTHER): Payer: No Typology Code available for payment source | Admitting: Cardiovascular Disease

## 2022-10-13 ENCOUNTER — Encounter (HOSPITAL_BASED_OUTPATIENT_CLINIC_OR_DEPARTMENT_OTHER): Payer: Self-pay | Admitting: Cardiovascular Disease

## 2022-10-13 ENCOUNTER — Ambulatory Visit (HOSPITAL_BASED_OUTPATIENT_CLINIC_OR_DEPARTMENT_OTHER): Payer: Medicare HMO | Admitting: Cardiovascular Disease

## 2022-10-13 VITALS — BP 116/68 | HR 55 | Ht 64.5 in | Wt 133.0 lb

## 2022-10-13 DIAGNOSIS — I2510 Atherosclerotic heart disease of native coronary artery without angina pectoris: Secondary | ICD-10-CM

## 2022-10-13 DIAGNOSIS — Z823 Family history of stroke: Secondary | ICD-10-CM

## 2022-10-13 DIAGNOSIS — R0789 Other chest pain: Secondary | ICD-10-CM | POA: Insufficient documentation

## 2022-10-13 HISTORY — DX: Atherosclerotic heart disease of native coronary artery without angina pectoris: I25.10

## 2022-10-13 NOTE — Progress Notes (Signed)
Cardiology Office Note:  .   Date:  10/13/2022  ID:  Heather Mercado, DOB [birth year], MRN [id number] PCP: Heather Mesi, MD  [facility] Providers Cardiologist:  None    History of Present Illness: Marland Kitchen   Heather Mercado is a 72 y.o. female with coronary calcification, hyperlipidemia and OSA here to establish care.  Heather Mercado presents with concerns about her cardiovascular health, prompted by the recent sudden death of her sister and her father's history of heart attack. She reports a recent coronary calcium score of 86,. The patient has been experiencing sporadic neck pain, predominantly on the right side, which she describes as lasting throughout the day when it occurs. She also reports occasional instances of feeling a "double beat" in her heart, and one episode of a racing heart while trying to fall asleep a couple of months ago.  The patient has been proactive in managing her health,.  She is physically active, participating in Pilates and walking regularly, although she acknowledges a need for more cardio exercise. She reports a recent weight gain of three pounds, which she attributes to dietary indulgences.  She expresses a willingness to consider dietary changes to improve her cardiovascular health, including reducing red meat consumption and increasing plant-based foods. She denies any issues with leg or feet swelling.      ROS:  See HPI  Studies Reviewed: .       10/27/21: Coronary calcium score 86.  50-75th percentile for age and gender.   10/13/22: Sinus bradycardia.  Rate 55 bpm.   Risk Assessment/Calculations:             Physical Exam:    VS:  BP 116/68   Pulse (!) 55   Ht 5' 4.5" (1.638 m)   Wt 133 lb (60.3 kg)   BMI 22.48 kg/m  , BMI Body mass index is 22.48 kg/m. GENERAL:  Well appearing HEENT: Pupils equal round and reactive, fundi not visualized, oral mucosa unremarkable NECK:  No jugular venous distention, waveform within normal limits, carotid  upstroke brisk and symmetric, no bruits, no thyromegaly LUNGS:  Clear to auscultation bilaterally HEART:  RRR.  PMI not displaced or sustained,S1 and S2 within normal limits, no S3, no S4, no clicks, no rubs, no murmurs ABD:  Flat, positive bowel sounds normal in frequency in pitch, no bruits, no rebound, no guarding, no midline pulsatile mass, no hepatomegaly, no splenomegaly EXT:  2 plus pulses throughout, no edema, no cyanosis no clubbing SKIN:  No rashes no nodules NEURO:  Cranial nerves II through XII grossly intact, motor grossly intact throughout PSYCH:  Cognitively intact, oriented to person place and time  ASSESSMENT AND PLAN: .    # Coronary Artery Disease:  Calcium score of 86 in the LAD, indicating some degree of coronary calcification. No symptoms of chest pain or exertional dyspnea. Occasional palpitations, but no associated lightheadedness or dizziness. -Order coronary CT angiogram to assess severity of coronary artery disease. -Check LP(a) as an additional marker of cardiovascular risk. -Obtain recent lipid panel from primary care physician -Consider initiation of statin therapy pending results of lipid panel and coronary CT angiogram.  #Neck Pain: Right-sided neck pain, sporadic in nature. No clear association with exertion or other cardiac symptoms. Likely musculoskeletal in origin. -Advise patient to monitor for any triggers or exacerbating factors.  # Lifestyle Modification: Patient acknowledges need for increased cardiovascular exercise and dietary modifications. Current diet includes red meat and occasional alcohol consumption. -Encourage increased cardiovascular exercise, such  as brisk walking. -Advise reduction in red meat consumption and increase in plant-based foods. -Recommend limiting alcohol to no more than one drink per day and no more than seven drinks per week.  Follow-up: Pending results of coronary CT angiogram, LP(a), and lipid panel. -Schedule follow-up  appointment to discuss results and potential initiation of statin therapy.     # Palpitations: Very rare episodes.  Will get labs from her PCP.  Recommended getting a Kardia mobile device or Apple watch. She had one episode several months ago after her sister's passing.   Dispo: f/u 6 months  Signed, Heather Si, MD

## 2022-10-13 NOTE — Patient Instructions (Addendum)
Medication Instructions:  Your physician recommends that you continue on your current medications as directed. Please refer to the Current Medication list given to you today.   *If you need a refill on your cardiac medications before your next appointment, please call your pharmacy*   Lab Work: BMET 1 WEEK PRIOR TO CT   If you have labs (blood work) drawn today and your tests are completely normal, you will receive your results only by: MyChart Message (if you have MyChart) OR A paper copy in the mail If you have any lab test that is abnormal or we need to change your treatment, we will call you to review the results.  Testing/Procedures: Your physician has requested that you have cardiac CT. Cardiac computed tomography (CT) is a painless test that uses an x-ray machine to take clear, detailed pictures of your heart. For further information please visit [email or web address] Please follow instruction sheet as given. THE OFFICE WILL CALL YOU TO SCHEDULE ONCE INSURANCE HAS BEEN REVIEWED. IF YOU DO NOT HEAR IN 2 WEEKS PLEASE CALL TO FOLLOW UP   Follow-Up: At [facility], you and your health needs are our priority.  As part of our continuing mission to provide you with exceptional heart care, we have created designated Provider Care Teams.  These Care Teams include your primary Cardiologist (physician) and Advanced Practice Providers (APPs -  Physician Assistants and Nurse Practitioners) who all work together to provide you with the care you need, when you need it.  We recommend signing up for the patient portal called "MyChart".  Sign up information is provided on this After Visit Summary.  MyChart is used to connect with patients for Virtual Visits (Telemedicine).  Patients are able to view lab/test results, encounter notes, upcoming appointments, etc.  Non-urgent messages can be sent to your provider as well.   To learn more about what you can do with MyChart, go to  [email or web address].    Your next appointment:   6 month(s)  Provider:   Chilton Si, MD or Gillian Shields, NP    Other Instructions  Consider getting a Kardia Mobile device to check your heart rhythm    Your cardiac CT will be scheduled at one of the below locations:   [facility] [address] [place], [place] [address] [phone]  OR  [facility] [address] [place], [place] [address] [phone]  OR   [facility] [address] [place], [place] [address] [phone]  If scheduled at [facility], please arrive at the [facility] and Children's Entrance (Entrance C2) of [facility] 30 minutes prior to test start time. You can use the FREE valet parking offered at entrance C (encouraged to control the heart rate for the test)  Proceed to the [facility] Radiology Department (first floor) to check-in and test prep.  All radiology patients and guests should use entrance C2 at [facility], accessed from [facility], even though the hospital's physical address listed is [address].    If scheduled at [facility] or [facility], please arrive 15 mins early for check-in and test prep.  There is spacious parking and easy access to the radiology department from the [facility] entrance. Please enter here and check-in with the desk attendant.   Please follow these instructions carefully (unless otherwise directed):  On the Night Before the Test: Be sure to  Drink plenty of water. Do not consume any caffeinated/decaffeinated beverages or chocolate 12 hours prior to your test. Do not take any antihistamines 12 hours prior to your test.  On the Day of the Test: Drink plenty of water until 1 hour prior to the test. Do not eat any food 1 hour prior to test. You may take your  regular medications prior to the test.  Take metoprolol (Lopressor) two hours prior to test. If you take Furosemide/Hydrochlorothiazide/Spironolactone, please HOLD on the morning of the test. FEMALES- please wear underwire-free bra if available, avoid dresses & tight clothing      After the Test: Drink plenty of water. After receiving IV contrast, you may experience a mild flushed feeling. This is normal. On occasion, you may experience a mild rash up to 24 hours after the test. This is not dangerous. If this occurs, you can take Benadryl 25 mg and increase your fluid intake. If you experience trouble breathing, this can be serious. If it is severe call 911 IMMEDIATELY. If it is mild, please call our office. If you take any of these medications: Glipizide/Metformin, Avandament, Glucavance, please do not take 48 hours after completing test unless otherwise instructed.  We will call to schedule your test 2-4 weeks out understanding that some insurance companies will need an authorization prior to the service being performed.   For more information and frequently asked questions, please visit our website : [email or web address]  For non-scheduling related questions, please contact the cardiac imaging nurse navigator should you have any questions/concerns: Cardiac Imaging Nurse Navigators Direct Office Dial: [phone]   For scheduling needs, including cancellations and rescheduling, please call [place], [phone].  Cardiac CT Angiogram A cardiac CT angiogram is a procedure to look at the heart and the area around the heart. It may be done to help find the cause of chest pains or other symptoms of heart disease. During this procedure, a substance called contrast dye is injected into a vein in the arm. The contrast highlights the blood vessels in the area to be checked. A large X-ray machine (CT scanner), then takes detailed pictures of the heart and the surrounding area. The  procedure is also sometimes called a coronary CT angiogram, coronary artery scanning, or CTA. A cardiac CT angiogram allows the health care provider to see how well blood is flowing to and from the heart. The provider will be able to see if there are any problems, such as: Blockage or narrowing of the arteries in the heart. Fluid around the heart. Signs of weakness or disease in the muscles, valves, and tissues of the heart. Tell a health care provider about: Any allergies you have. This is especially important if you have had a previous allergic reaction to medicines, contrast dye, or iodine. All medicines you are taking, including vitamins, herbs, eye drops, creams, and over-the-counter medicines. Any bleeding problems you have. Any surgeries you have had. Any medical conditions you have, including kidney problems or kidney failure. Whether you are pregnant or may be pregnant. Any anxiety disorders, chronic pain, or other conditions you have. These may increase your stress or prevent you from lying still. Any history of abnormal heart rhythms or heart procedures. What are the risks? Your provider will talk with you about risks. These may include: Bleeding. Infection. Allergic reactions to medicines or dyes. Damage to other structures or organs. Kidney damage from the contrast dye. Increased risk of cancer from radiation exposure. This risk is low. Talk with your provider about:  The risks and benefits of testing. How you can receive the lowest dose of radiation. What happens before the procedure? Wear comfortable clothing and remove any jewelry, glasses, dentures, and hearing aids. Follow instructions from your provider about eating and drinking. These may include: 12 hours before the procedure Avoid caffeine. This includes tea, coffee, soda, energy drinks, and diet pills. Drink plenty of water or other fluids that do not have caffeine in them. Being well hydrated can prevent  complications. 4-6 hours before the procedure Stop eating and drinking. This will reduce the risk of nausea from the contrast dye. Ask your provider about changing or stopping your regular medicines. These include: Diabetes medicines. Medicines to treat problems with erections (erectile dysfunction). If you have kidney problems, you may need to receive IV hydration before and after the test. What happens during the procedure?  Hair on your chest may need to be removed so that small sticky patches called electrodes can be placed on your chest. These will transmit information that helps to monitor your heart during the procedure. An IV will be inserted into one of your veins. You might be given a medicine to control your heart rate during the procedure. This will help to ensure that good images are obtained. You will be asked to lie on an exam table. This table will slide in and out of the CT machine during the procedure. Contrast dye will be injected into the IV. You might feel warm, or you may get a metallic taste in your mouth. You may be given medicines to relax or dilate the arteries in your heart. If you are allergic to contrast dyes or iodine you may be given medicine before the test to reduce the risk of an allergic reaction. The table that you are lying on will move into the CT machine tunnel for the scan. The person running the machine will give you instructions while the scans are being done. You may be asked to: Keep your arms above your head. Hold your breath for short periods. Stay very still, even if the table is moving. The procedure may vary among providers and hospitals. What can I expect after the procedure? After your procedure, it is common to have: A metallic taste in your mouth from the contrast dye. A feeling of warmth. A headache from the heart medicine. Follow these instructions at home: Take over-the-counter and prescription medicines only as told by your  provider. If you are told, drink enough fluid to keep your pee pale yellow. This will help to flush the contrast dye out of your body. Most people can return to their normal activities right after the procedure. Ask your provider what activities are safe for you. It is up to you to get the results of your procedure. Ask your provider, or the department that is doing the procedure, when your results will be ready. Contact a health care provider if: You have any symptoms of allergy to the contrast dye. These include: Shortness of breath. Rash or hives. A racing heartbeat. You notice a change in your peeing (urination). This information is not intended to replace advice given to you by your health care provider. Make sure you discuss any questions you have with your health care provider. Document Revised: 10/11/2021 Document Reviewed: 10/11/2021 Elsevier Patient Education  2024 [employer].

## 2022-10-14 ENCOUNTER — Encounter (HOSPITAL_BASED_OUTPATIENT_CLINIC_OR_DEPARTMENT_OTHER): Payer: Self-pay

## 2022-10-17 ENCOUNTER — Telehealth (HOSPITAL_COMMUNITY): Payer: Self-pay | Admitting: Emergency Medicine

## 2022-10-17 NOTE — Telephone Encounter (Signed)
Reaching out to patient to offer assistance regarding upcoming cardiac imaging study; pt verbalizes understanding of appt date/time, parking situation and where to check in, pre-test NPO status and medications ordered, and verified current allergies; name and call back number provided for further questions should they arise Sara Wallace RN Navigator Cardiac Imaging [facility] [phone] office [phone] cell 

## 2022-10-20 ENCOUNTER — Ambulatory Visit (HOSPITAL_COMMUNITY)
Admission: RE | Admit: 2022-10-20 | Discharge: 2022-10-20 | Disposition: A | Discharge status: Home / Self Care | Payer: Medicare HMO | Source: Ambulatory Visit | Attending: Cardiovascular Disease | Admitting: Cardiovascular Disease

## 2022-10-20 DIAGNOSIS — R0789 Other chest pain: Secondary | ICD-10-CM | POA: Diagnosis not present

## 2022-10-20 DIAGNOSIS — I2510 Atherosclerotic heart disease of native coronary artery without angina pectoris: Secondary | ICD-10-CM | POA: Insufficient documentation

## 2022-10-20 DIAGNOSIS — I700 Atherosclerosis of aorta: Secondary | ICD-10-CM

## 2022-10-20 MED ORDER — IOHEXOL 350 MG/ML SOLN
100.0000 mL | Freq: Once | INTRAVENOUS | Status: AC | PRN
  Administered 2022-10-20: 100 mL via INTRAVENOUS

## 2022-10-20 MED ORDER — NITROGLYCERIN 0.4 MG SL SUBL
SUBLINGUAL_TABLET | SUBLINGUAL | Status: AC
  Filled 2022-10-20: qty 2

## 2022-10-20 MED ORDER — NITROGLYCERIN 0.4 MG SL SUBL
0.8000 mg | SUBLINGUAL_TABLET | Freq: Once | SUBLINGUAL | Status: AC
  Administered 2022-10-20: 0.8 mg via SUBLINGUAL

## 2022-10-21 ENCOUNTER — Telehealth: Payer: Self-pay | Admitting: Cardiovascular Disease

## 2022-10-21 DIAGNOSIS — E7849 Other hyperlipidemia: Secondary | ICD-10-CM

## 2022-10-21 NOTE — Telephone Encounter (Signed)
Patient is requesting a call back to discuss CT results.

## 2022-10-24 DIAGNOSIS — L2489 Irritant contact dermatitis due to other agents: Secondary | ICD-10-CM | POA: Diagnosis not present

## 2022-10-28 ENCOUNTER — Encounter: Payer: Self-pay | Admitting: Family Medicine

## 2022-11-03 DIAGNOSIS — R0789 Other chest pain: Secondary | ICD-10-CM | POA: Diagnosis not present

## 2022-11-04 ENCOUNTER — Other Ambulatory Visit: Payer: No Typology Code available for payment source

## 2022-11-04 NOTE — Telephone Encounter (Signed)
Labs via [facility]:  NMR profile 07/30/22 by PCP  LDL-P 1150 (H) LDL-C 114 (H) HDL-C 109 Triglycerides 66 Total cholesterol 234 (H) HDL-P 40.6 Small LDL-P <90  LDL size 22.1  CMP 07/30/22 Creatinine 0.73, AST 24, ALT 20  Alver Sorrow, NP

## 2022-11-04 NOTE — Telephone Encounter (Signed)
Would you feel comfortable looking at her labs?

## 2022-11-06 MED ORDER — ROSUVASTATIN CALCIUM 20 MG PO TABS
20.0000 mg | ORAL_TABLET | Freq: Every day | ORAL | 3 refills | Status: DC
Start: 2022-11-06 — End: 2023-07-08

## 2022-11-06 MED ORDER — ASPIRIN 81 MG PO TBEC
81.0000 mg | DELAYED_RELEASE_TABLET | Freq: Every day | ORAL | Status: AC
Start: 2022-11-06 — End: ?

## 2022-11-06 NOTE — Addendum Note (Signed)
Addended by: Marlene Lard on: 11/06/2022 07:49 AM   Modules accepted: Orders

## 2022-11-18 DIAGNOSIS — G4733 Obstructive sleep apnea (adult) (pediatric): Secondary | ICD-10-CM | POA: Diagnosis not present

## 2022-12-02 ENCOUNTER — Ambulatory Visit
Admission: RE | Admit: 2022-12-02 | Discharge: 2022-12-02 | Disposition: A | Discharge status: Home / Self Care | Payer: Medicare HMO | Source: Ambulatory Visit | Attending: Family Medicine | Admitting: Family Medicine

## 2022-12-02 DIAGNOSIS — Z87891 Personal history of nicotine dependence: Secondary | ICD-10-CM

## 2022-12-02 DIAGNOSIS — R918 Other nonspecific abnormal finding of lung field: Secondary | ICD-10-CM | POA: Diagnosis not present

## 2022-12-02 DIAGNOSIS — J479 Bronchiectasis, uncomplicated: Secondary | ICD-10-CM | POA: Diagnosis not present

## 2022-12-19 DIAGNOSIS — G4733 Obstructive sleep apnea (adult) (pediatric): Secondary | ICD-10-CM | POA: Diagnosis not present

## 2023-01-18 DIAGNOSIS — G4733 Obstructive sleep apnea (adult) (pediatric): Secondary | ICD-10-CM | POA: Diagnosis not present

## 2023-04-10 ENCOUNTER — Telehealth: Payer: Self-pay | Admitting: Nurse Practitioner

## 2023-04-10 NOTE — Telephone Encounter (Signed)
Patient would like order for CPAP machine. Has appointment 04/30/2023 with Rhunette Croft NP. Former patient of Dr. Vassie Loll. Sleep study 07/19/2014. Patient phone number is [phone].

## 2023-04-21 NOTE — Telephone Encounter (Signed)
Patient informed she has not been seen in 5 years and ins will not pay for new cpap without ov notes. She has appt 2/6 with [place] offered to see if she could come in sooner, she declined and will see [place] on the 6th.

## 2023-04-27 ENCOUNTER — Other Ambulatory Visit: Payer: Self-pay | Admitting: Family Medicine

## 2023-04-27 DIAGNOSIS — Z1231 Encounter for screening mammogram for malignant neoplasm of breast: Secondary | ICD-10-CM

## 2023-04-30 ENCOUNTER — Encounter: Payer: Self-pay | Admitting: Nurse Practitioner

## 2023-04-30 ENCOUNTER — Ambulatory Visit: Payer: Medicare HMO | Admitting: Nurse Practitioner

## 2023-04-30 VITALS — BP 112/66 | HR 70 | Temp 98.7°F | Ht 65.5 in | Wt 133.0 lb

## 2023-04-30 DIAGNOSIS — G4733 Obstructive sleep apnea (adult) (pediatric): Secondary | ICD-10-CM | POA: Diagnosis not present

## 2023-04-30 NOTE — Assessment & Plan Note (Signed)
 Mild OSA on CPAP. Excellent compliance and control. Receives benefit from use. She is due for new CPAP machine. Orders placed today for auto CPAP 5-10 cmH2O, mask of choice and heated humidity. She understands proper care/use of device. Minimal cardiovascular risks of untreated mild OSA. She did have significant daytime symptoms prior to treatment. Aware of safe driving practices.   Patient Instructions  Continue to use CPAP every night, minimum of 4-6 hours a night.  Change equipment as directed. Wash your tubing with warm soap and water daily, hang to dry. Wash humidifier portion weekly. Use bottled, distilled water and change daily Be aware of reduced alertness and do not drive or operate heavy machinery if experiencing this or drowsiness.  Exercise encouraged, as tolerated. Healthy weight management discussed.  Avoid or decrease alcohol consumption and medications that make you more sleepy, if possible. Notify if persistent daytime sleepiness occurs even with consistent use of PAP therapy.  Orders placed for new CPAP machine  Follow up in 10-12 weeks with Heather Leanny Moeckel,NP, or sooner, if needed

## 2023-04-30 NOTE — Progress Notes (Signed)
 @Patient  ID: Heather Mercado, female    DOB: 03/26/1950, 73 y.o.   MRN: [id number]  Chief Complaint  Patient presents with   Consult    Dx OSA.  Needs new CPAP.  Has had CPAP since 2017.    Referring provider: Hughie Sharper, MD  HPI: 73 year old female, former smoker referred for sleep consult.  Past medical history significant for CAD, OSA on CPAP, history of right breast cancer.  TEST/EVENTS:  06/2014 PSG: AHI 10/h, REM AHI 33/h, SpO2 low 71%  08/27/2015: OV with Dr. Jude.  History of mild OSA.  Previously bought an oral but was unable to tolerate this.  Wants to try CPAP.  Has morning headaches and daytime fatigue.  Trial of auto CPAP 5-10 cmH2O with nasal pillows.  04/30/2023: Today-sleep consult Patient presents today for sleep consult to reestablish care for sleep apnea.  She has been on CPAP since 2017.  She wears her CPAP nightly.  She receives benefit from use.  Feels well rested.  Feels like energy levels are good during the day.  Sleeps well at night.  Needs a new CPAP machine.  She is starting to get a motor life warning.  Denies any issues with morning headaches, sleep parasomnia/paralysis, drowsy driving. Goes to bed between 10 PM and midnight.  Falls asleep relatively quickly.  Wakes once a night to use the restroom.  Gets up between 7 to 8:30 AM.  Weight has been relatively stable over the last 2 years.  Last sleep study in 2017.  CPAP pressure 5-10 cmH2O.   Wearing a nasal pillow mask. No significant cardiac disease, history of stroke or diabetes. She is a former smoker.  Quit in 1984.  Does not drink alcohol.  No excessive caffeine intake.  No sleep aids.  Lives with her husband.  [facility] 0  03/31/2023-04/29/2023: CPAP 5-10 cmH2O 30/30 days; 100% >4 hr; average use 7 hr 33 min Pressure 95th 9.4 Leaks 95th 21.8 AHI 0.2  Allergies  Allergen Reactions   Sulfa Antibiotics Rash    Immunization History  Administered Date(s) Administered   Influenza Split 01/10/2015    Influenza-Unspecified 12/22/2013   Moderna SARS-COV2 Booster Vaccination 02/03/2020    Past Medical History:  Diagnosis Date   CAD in native artery 10/13/2022   History of breast cancer 12/25/2011   Right   History of radiation therapy    Osteopenia 02/2012   T score -1.9 stable from prior DEXA   Personal history of chemotherapy 05/2001   Personal history of radiation therapy 07/2001   Sleep apnea     Tobacco History: Social History   Tobacco Use  Smoking Status Former   Current packs/day: 0.00   Average packs/day: 0.5 packs/day for 10.0 years (5.0 ttl pk-yrs)   Types: Cigarettes   Start date: 03/24/1972   Quit date: 03/24/1982   Years since quitting: 41.1  Smokeless Tobacco Never   Counseling given: Not Answered   Outpatient Medications Prior to Visit  Medication Sig Dispense Refill   aspirin  EC 81 MG tablet Take 1 tablet (81 mg total) by mouth daily. Swallow whole.     Collagen-Vitamin C (COLLAGEN PLUS VITAMIN C PO) Take by mouth daily.     fish oil-omega-3 fatty acids 1000 MG capsule Take 1 g by mouth daily.       glucosamine-chondroitin 500-400 MG tablet Take 1 tablet by mouth daily.     MAGNESIUM PO Take by mouth as needed.     Probiotic Product (PROBIOTIC PO) Take  by mouth.     Pyridoxine HCl (VITAMIN B-6 PO) Take by mouth daily.     rosuvastatin  (CRESTOR ) 20 MG tablet Take 1 tablet (20 mg total) by mouth daily. 90 tablet 3   No facility-administered medications prior to visit.     Review of Systems:   Constitutional: No weight loss or gain, night sweats, fevers, chills, fatigue, or lassitude. HEENT: No headaches, difficulty swallowing, tooth/dental problems, or sore throat. No sneezing, itching, ear ache, nasal congestion, or post nasal drip CV:  No chest pain, orthopnea, PND, swelling in lower extremities, anasarca, dizziness, palpitations, syncope Resp: No shortness of breath with exertion or at rest. No cough GI:  No heartburn, indigestion GU: No  nocturia  Skin: No rash, lesions, ulcerations MSK:  No joint pain or swelling.  Neuro: No dizziness or lightheadedness.  Psych: No depression or anxiety. Mood stable.     Physical Exam:  BP 112/66 (BP Location: Left Arm, Patient Position: Sitting, Cuff Size: Normal)   Pulse 70   Temp 98.7 F (37.1 C) (Oral)   Ht 5' 5.5 (1.664 m)   Wt 133 lb (60.3 kg)   SpO2 95%   BMI 21.80 kg/m   GEN: Pleasant, interactive, well-appearing; appears younger than stated age; in no acute distress HEENT:  Normocephalic and atraumatic. PERRLA. Sclera white. Nasal turbinates pink, moist and patent bilaterally. No rhinorrhea present. Oropharynx pink and moist, without exudate or edema. No lesions, ulcerations, or postnasal drip. Mallampati II NECK:  Supple w/ fair ROM. Thyroid  symmetrical with no goiter or nodules palpated. No lymphadenopathy.   CV: RRR, no m/r/g, no peripheral edema. Pulses intact, +2 bilaterally. No cyanosis, pallor or clubbing. PULMONARY:  Unlabored, regular breathing. Clear bilaterally A&P w/o wheezes/rales/rhonchi. No accessory muscle use.  GI: BS present and normoactive. Soft, non-tender to palpation. No organomegaly or masses detected. MSK: No erythema, warmth or tenderness.  Neuro: A/Ox3. No focal deficits noted.   Skin: Warm, no lesions or rashe Psych: Normal affect and behavior. Judgement and thought content appropriate.     Lab Results:  CBC    Component Value Date/Time   WBC 4.3 12/05/2020 1546   WBC 3.5 (L) 06/09/2014 1312   WBC 3.8 (L) 05/01/2014 1140   RBC 4.78 12/05/2020 1546   HGB 14.1 12/05/2020 1546   HGB 13.6 06/09/2014 1312   HCT 42.4 12/05/2020 1546   HCT 40.4 06/09/2014 1312   PLT 167 12/05/2020 1546   PLT 138 (L) 06/09/2014 1312   MCV 88.7 12/05/2020 1546   MCV 89.0 06/09/2014 1312   MCH 29.5 12/05/2020 1546   MCHC 33.3 12/05/2020 1546   RDW 14.2 12/05/2020 1546   RDW 13.9 06/09/2014 1312   LYMPHSABS 1.7 12/05/2020 1546   LYMPHSABS 1.6  06/09/2014 1312   MONOABS 0.3 12/05/2020 1546   MONOABS 0.3 06/09/2014 1312   EOSABS 0.1 12/05/2020 1546   EOSABS 0.1 06/09/2014 1312   BASOSABS 0.0 12/05/2020 1546   BASOSABS 0.0 06/09/2014 1312    BMET    Component Value Date/Time   NA 140 11/03/2022 1533   NA 141 06/09/2014 1312   K 4.1 11/03/2022 1533   K 3.9 06/09/2014 1312   CL 107 (H) 11/03/2022 1533   CL 105 03/02/2012 0957   CO2 22 11/03/2022 1533   CO2 25 06/09/2014 1312   GLUCOSE 92 11/03/2022 1533   GLUCOSE 90 12/05/2020 1546   GLUCOSE 87 06/09/2014 1312   GLUCOSE 91 03/02/2012 0957   BUN 20 11/03/2022 1533  BUN 17.4 06/09/2014 1312   CREATININE 0.72 11/03/2022 1533   CREATININE 0.79 12/05/2020 1546   CREATININE 0.7 06/09/2014 1312   CALCIUM  8.9 11/03/2022 1533   CALCIUM  8.9 06/09/2014 1312   GFRNONAA >60 12/05/2020 1546    BNP No results found for: BNP   Imaging:  No results found.  Administration History     None           No data to display          No results found for: NITRICOXIDE      Assessment & Plan:   OSA (obstructive sleep apnea) Mild OSA on CPAP. Excellent compliance and control. Receives benefit from use. She is due for new CPAP machine. Orders placed today for auto CPAP 5-10 cmH2O, mask of choice and heated humidity. She understands proper care/use of device. Minimal cardiovascular risks of untreated mild OSA. She did have significant daytime symptoms prior to treatment. Aware of safe driving practices.   Patient Instructions  Continue to use CPAP every night, minimum of 4-6 hours a night.  Change equipment as directed. Wash your tubing with warm soap and water daily, hang to dry. Wash humidifier portion weekly. Use bottled, distilled water and change daily Be aware of reduced alertness and do not drive or operate heavy machinery if experiencing this or drowsiness.  Exercise encouraged, as tolerated. Healthy weight management discussed.  Avoid or decrease alcohol  consumption and medications that make you more sleepy, if possible. Notify if persistent daytime sleepiness occurs even with consistent use of PAP therapy.  Orders placed for new CPAP machine  Follow up in 10-12 weeks with Izetta Bradan Congrove,NP, or sooner, if needed    Advised if symptoms do not improve or worsen, to please contact office for sooner follow up or seek emergency care.   I spent 35 minutes of dedicated to the care of this patient on the date of this encounter to include pre-visit review of records, face-to-face time with the patient discussing conditions above, post visit ordering of testing, clinical documentation with the electronic health record, making appropriate referrals as documented, and communicating necessary findings to members of the patients care team.  Comer LULLA Rouleau, NP 04/30/2023  Pt aware and understands NP's role.

## 2023-04-30 NOTE — Patient Instructions (Signed)
 Continue to use CPAP every night, minimum of 4-6 hours a night.  Change equipment as directed. Wash your tubing with warm soap and water daily, hang to dry. Wash humidifier portion weekly. Use bottled, distilled water and change daily Be aware of reduced alertness and do not drive or operate heavy machinery if experiencing this or drowsiness.  Exercise encouraged, as tolerated. Healthy weight management discussed.  Avoid or decrease alcohol consumption and medications that make you more sleepy, if possible. Notify if persistent daytime sleepiness occurs even with consistent use of PAP therapy.  Orders placed for new CPAP machine  Follow up in 10-12 weeks with Heather Zoha Spranger,NP, or sooner, if needed

## 2023-05-25 ENCOUNTER — Ambulatory Visit
Admission: RE | Admit: 2023-05-25 | Discharge: 2023-05-25 | Disposition: A | Discharge status: Home / Self Care | Payer: Medicare HMO | Source: Ambulatory Visit | Attending: Family Medicine | Admitting: Family Medicine

## 2023-05-25 DIAGNOSIS — G4733 Obstructive sleep apnea (adult) (pediatric): Secondary | ICD-10-CM | POA: Diagnosis not present

## 2023-05-25 DIAGNOSIS — Z1231 Encounter for screening mammogram for malignant neoplasm of breast: Secondary | ICD-10-CM | POA: Diagnosis not present

## 2023-06-24 DIAGNOSIS — L578 Other skin changes due to chronic exposure to nonionizing radiation: Secondary | ICD-10-CM | POA: Diagnosis not present

## 2023-06-24 DIAGNOSIS — L821 Other seborrheic keratosis: Secondary | ICD-10-CM | POA: Diagnosis not present

## 2023-06-24 DIAGNOSIS — Z85828 Personal history of other malignant neoplasm of skin: Secondary | ICD-10-CM | POA: Diagnosis not present

## 2023-06-24 DIAGNOSIS — L719 Rosacea, unspecified: Secondary | ICD-10-CM | POA: Diagnosis not present

## 2023-06-25 DIAGNOSIS — G4733 Obstructive sleep apnea (adult) (pediatric): Secondary | ICD-10-CM | POA: Diagnosis not present

## 2023-07-07 ENCOUNTER — Encounter (HOSPITAL_BASED_OUTPATIENT_CLINIC_OR_DEPARTMENT_OTHER): Payer: Self-pay | Admitting: Cardiovascular Disease

## 2023-07-08 MED ORDER — ATORVASTATIN CALCIUM 20 MG PO TABS: 20.0000 mg | ORAL_TABLET | Freq: Every day | ORAL | 1 refills | Status: DC

## 2023-07-22 ENCOUNTER — Ambulatory Visit: Payer: Medicare HMO | Admitting: Nurse Practitioner

## 2023-07-22 ENCOUNTER — Encounter: Payer: Self-pay | Admitting: Nurse Practitioner

## 2023-07-22 VITALS — BP 102/72 | HR 72 | Ht 65.0 in | Wt 132.6 lb

## 2023-07-22 DIAGNOSIS — G4733 Obstructive sleep apnea (adult) (pediatric): Secondary | ICD-10-CM

## 2023-07-22 NOTE — Progress Notes (Signed)
 @Patient  ID: Heather Mercado, female    DOB: 1951/03/10, 73 y.o.   MRN: [id number]  Chief Complaint  Patient presents with   Follow-up    Osa follow-up    Referring provider: Rey Catholic, MD  HPI: 73 year old female, former smoker followed for sleep apnea.  Past medical history significant for CAD, OSA on CPAP, history of right breast cancer.  TEST/EVENTS:  06/2014 PSG: AHI 10/h, REM AHI 33/h, SpO2 low 71%  08/27/2015: OV with Dr. Villa Greaser.  History of mild OSA.  Previously bought an oral but was unable to tolerate this.  Wants to try CPAP.  Has morning headaches and daytime fatigue.  Trial of auto CPAP 5-10 cmH2O with nasal pillows.  04/30/2023: OV with Lanita Stammen NP for sleep consult to reestablish care for sleep apnea.  She has been on CPAP since 2017.  She wears her CPAP nightly.  She receives benefit from use.  Feels well rested.  Feels like energy levels are good during the day.  Sleeps well at night.  Needs a new CPAP machine.  She is starting to get a motor life warning.  Denies any issues with morning headaches, sleep parasomnia/paralysis, drowsy driving. Goes to bed between 10 PM and midnight.  Falls asleep relatively quickly.  Wakes once a night to use the restroom.  Gets up between 7 to 8:30 AM.  Weight has been relatively stable over the last 2 years.  Last sleep study in 2017.  CPAP pressure 5-10 cmH2O.   Wearing a nasal pillow mask. No significant cardiac disease, history of stroke or diabetes. She is a former smoker.  Quit in 1984.  Does not drink alcohol.  No excessive caffeine intake.  No sleep aids.  Lives with her husband. [facility] 0 03/31/2023-04/29/2023: CPAP 5-10 cmH2O 30/30 days; 100% >4 hr; average use 7 hr 33 min Pressure 95th 9.4 Leaks 95th 21.8 AHI 0.2  07/22/2023: Today - follow up Patient presents today for follow-up after receiving his new CPAP machine.  She does very well with her CPAP.  Wears it nightly.  Feels like she rests well with it.  Energy levels are good  during the day.  No issues with drowsy driving or morning headaches.  No issues with leaks.    06/21/2023-07/20/2023: CPAP 5-10 cmH2O 29/30 days; 97% >4 hr; average use 7 hr 37 min Pressure 95th 9.1 Leaks 95th 31.2 AHI 0.2  Allergies  Allergen Reactions   Rosuvastatin  Other (See Comments)    Developed mouth ulcers   Sulfa Antibiotics Rash    Immunization History  Administered Date(s) Administered   Influenza Split 01/10/2015   Influenza-Unspecified 12/22/2013   Moderna SARS-COV2 Booster Vaccination 02/03/2020    Past Medical History:  Diagnosis Date   CAD in native artery 10/13/2022   History of breast cancer 12/25/2011   Right   History of radiation therapy    Osteopenia 02/2012   T score -1.9 stable from prior DEXA   Personal history of chemotherapy 05/2001   Personal history of radiation therapy 07/2001   Sleep apnea     Tobacco History: Social History   Tobacco Use  Smoking Status Former   Current packs/day: 0.00   Average packs/day: 0.5 packs/day for 10.0 years (5.0 ttl pk-yrs)   Types: Cigarettes   Start date: 03/24/1972   Quit date: 03/24/1982   Years since quitting: 41.3  Smokeless Tobacco Never   Counseling given: Not Answered   Outpatient Medications Prior to Visit  Medication Sig Dispense Refill  aspirin  EC 81 MG tablet Take 1 tablet (81 mg total) by mouth daily. Swallow whole.     Collagen-Vitamin C (COLLAGEN PLUS VITAMIN C PO) Take by mouth daily.     fish oil-omega-3 fatty acids 1000 MG capsule Take 1 g by mouth daily.       glucosamine-chondroitin 500-400 MG tablet Take 1 tablet by mouth daily.     MAGNESIUM PO Take by mouth as needed.     Probiotic Product (PROBIOTIC PO) Take by mouth.     Pyridoxine HCl (VITAMIN B-6 PO) Take by mouth daily.     atorvastatin  (LIPITOR) 20 MG tablet Take 1 tablet (20 mg total) by mouth daily. (Patient not taking: Reported on 07/22/2023) 30 tablet 1   No facility-administered medications prior to visit.      Review of Systems:   Constitutional: No weight loss or gain, night sweats, fevers, chills, fatigue, or lassitude. HEENT: No headaches, difficulty swallowing, tooth/dental problems, or sore throat. No sneezing, itching, ear ache, nasal congestion, or post nasal drip CV:  No chest pain, orthopnea, PND, swelling in lower extremities, anasarca, dizziness, palpitations, syncope Resp: No shortness of breath with exertion or at rest. No cough GI:  No heartburn, indigestion GU: No nocturia  Skin: No rash, lesions, ulcerations MSK:  No joint pain or swelling.  Neuro: No dizziness or lightheadedness.  Psych: No depression or anxiety. Mood stable.     Physical Exam:  BP 102/72 (BP Location: Right Arm, Patient Position: Sitting)   Pulse 72   Ht 5\' 5"  (1.651 m)   Wt 132 lb 9.6 oz (60.1 kg)   SpO2 96%   BMI 22.07 kg/m   GEN: Pleasant, interactive, well-appearing; appears younger than stated age; in no acute distress HEENT:  Normocephalic and atraumatic. PERRLA. Sclera white. Nasal turbinates pink, moist and patent bilaterally. No rhinorrhea present. Oropharynx pink and moist, without exudate or edema. No lesions, ulcerations, or postnasal drip. Mallampati II NECK:  Supple w/ fair ROM. Thyroid  symmetrical with no goiter or nodules palpated. No lymphadenopathy.   CV: RRR, no m/r/g, no peripheral edema. Pulses intact, +2 bilaterally. No cyanosis, pallor or clubbing. PULMONARY:  Unlabored, regular breathing. Clear bilaterally A&P w/o wheezes/rales/rhonchi. No accessory muscle use.  GI: BS present and normoactive. Soft, non-tender to palpation. No organomegaly or masses detected. MSK: No erythema, warmth or tenderness.  Neuro: A/Ox3. No focal deficits noted.   Skin: Warm, no lesions or rashe Psych: Normal affect and behavior. Judgement and thought content appropriate.     Lab Results:  CBC    Component Value Date/Time   WBC 4.3 12/05/2020 1546   WBC 3.5 (L) 06/09/2014 1312   WBC 3.8  (L) 05/01/2014 1140   RBC 4.78 12/05/2020 1546   HGB 14.1 12/05/2020 1546   HGB 13.6 06/09/2014 1312   HCT 42.4 12/05/2020 1546   HCT 40.4 06/09/2014 1312   PLT 167 12/05/2020 1546   PLT 138 (L) 06/09/2014 1312   MCV 88.7 12/05/2020 1546   MCV 89.0 06/09/2014 1312   MCH 29.5 12/05/2020 1546   MCHC 33.3 12/05/2020 1546   RDW 14.2 12/05/2020 1546   RDW 13.9 06/09/2014 1312   LYMPHSABS 1.7 12/05/2020 1546   LYMPHSABS 1.6 06/09/2014 1312   MONOABS 0.3 12/05/2020 1546   MONOABS 0.3 06/09/2014 1312   EOSABS 0.1 12/05/2020 1546   EOSABS 0.1 06/09/2014 1312   BASOSABS 0.0 12/05/2020 1546   BASOSABS 0.0 06/09/2014 1312    BMET    Component Value Date/Time  NA 140 11/03/2022 1533   NA 141 06/09/2014 1312   K 4.1 11/03/2022 1533   K 3.9 06/09/2014 1312   CL 107 (H) 11/03/2022 1533   CL 105 03/02/2012 0957   CO2 22 11/03/2022 1533   CO2 25 06/09/2014 1312   GLUCOSE 92 11/03/2022 1533   GLUCOSE 90 12/05/2020 1546   GLUCOSE 87 06/09/2014 1312   GLUCOSE 91 03/02/2012 0957   BUN 20 11/03/2022 1533   BUN 17.4 06/09/2014 1312   CREATININE 0.72 11/03/2022 1533   CREATININE 0.79 12/05/2020 1546   CREATININE 0.7 06/09/2014 1312   CALCIUM  8.9 11/03/2022 1533   CALCIUM  8.9 06/09/2014 1312   GFRNONAA >60 12/05/2020 1546    BNP No results found for: "BNP"   Imaging:  No results found.  Administration History     None           No data to display          No results found for: "NITRICOXIDE"      Assessment & Plan:   OSA (obstructive sleep apnea) Mild OSA; severe in REM. On CPAP. Excellent compliance and control. Receives benefit from use. Aware of proper care/use of device. Understands risks of untreated OSA. Safe driving practices reviewed.  Patient Instructions  Continue to use CPAP every night, minimum of 4-6 hours a night.  Change equipment as directed. Wash your tubing with warm soap and water daily, hang to dry. Wash humidifier portion weekly. Use  bottled, distilled water and change daily Be aware of reduced alertness and do not drive or operate heavy machinery if experiencing this or drowsiness.  Exercise encouraged, as tolerated. Healthy weight management discussed.  Avoid or decrease alcohol consumption and medications that make you more sleepy, if possible. Notify if persistent daytime sleepiness occurs even with consistent use of PAP therapy.  Great job! Things look fantastic  Follow up in one year with Katie Damany Eastman,NP, or sooner, if needed    Advised if symptoms do not improve or worsen, to please contact office for sooner follow up or seek emergency care.   I spent 25 minutes of dedicated to the care of this patient on the date of this encounter to include pre-visit review of records, face-to-face time with the patient discussing conditions above, post visit ordering of testing, clinical documentation with the electronic health record, making appropriate referrals as documented, and communicating necessary findings to members of the patients care team.  Roetta Clarke, NP 07/22/2023  Pt aware and understands NP's role.

## 2023-07-22 NOTE — Patient Instructions (Signed)
 Continue to use CPAP every night, minimum of 4-6 hours a night.  Change equipment as directed. Wash your tubing with warm soap and water daily, hang to dry. Wash humidifier portion weekly. Use bottled, distilled water and change daily Be aware of reduced alertness and do not drive or operate heavy machinery if experiencing this or drowsiness.  Exercise encouraged, as tolerated. Healthy weight management discussed.  Avoid or decrease alcohol consumption and medications that make you more sleepy, if possible. Notify if persistent daytime sleepiness occurs even with consistent use of PAP therapy.  Great job! Things look fantastic  Follow up in one year with Heather Jewelz Kobus,NP, or sooner, if needed

## 2023-07-22 NOTE — Assessment & Plan Note (Signed)
 Mild OSA; severe in REM. On CPAP. Excellent compliance and control. Receives benefit from use. Aware of proper care/use of device. Understands risks of untreated OSA. Safe driving practices reviewed.  Patient Instructions  Continue to use CPAP every night, minimum of 4-6 hours a night.  Change equipment as directed. Wash your tubing with warm soap and water daily, hang to dry. Wash humidifier portion weekly. Use bottled, distilled water and change daily Be aware of reduced alertness and do not drive or operate heavy machinery if experiencing this or drowsiness.  Exercise encouraged, as tolerated. Healthy weight management discussed.  Avoid or decrease alcohol consumption and medications that make you more sleepy, if possible. Notify if persistent daytime sleepiness occurs even with consistent use of PAP therapy.  Great job! Things look fantastic  Follow up in one year with Heather Dinh Ayotte,NP, or sooner, if needed

## 2023-07-25 DIAGNOSIS — G4733 Obstructive sleep apnea (adult) (pediatric): Secondary | ICD-10-CM | POA: Diagnosis not present

## 2023-07-30 ENCOUNTER — Other Ambulatory Visit: Payer: Self-pay

## 2023-07-30 MED ORDER — ATORVASTATIN CALCIUM 20 MG PO TABS: 20.0000 mg | ORAL_TABLET | Freq: Every day | ORAL | 0 refills | Status: AC

## 2023-08-13 DIAGNOSIS — Z961 Presence of intraocular lens: Secondary | ICD-10-CM | POA: Diagnosis not present

## 2023-08-13 DIAGNOSIS — H40023 Open angle with borderline findings, high risk, bilateral: Secondary | ICD-10-CM | POA: Diagnosis not present

## 2023-08-13 DIAGNOSIS — H04123 Dry eye syndrome of bilateral lacrimal glands: Secondary | ICD-10-CM | POA: Diagnosis not present

## 2023-08-19 ENCOUNTER — Ambulatory Visit
Admission: RE | Admit: 2023-08-19 | Discharge: 2023-08-19 | Disposition: A | Discharge status: Home / Self Care | Source: Ambulatory Visit | Attending: Family Medicine | Admitting: Family Medicine

## 2023-08-19 ENCOUNTER — Other Ambulatory Visit: Payer: Self-pay | Admitting: Family Medicine

## 2023-08-19 DIAGNOSIS — R059 Cough, unspecified: Secondary | ICD-10-CM | POA: Diagnosis not present

## 2023-08-19 DIAGNOSIS — R058 Other specified cough: Secondary | ICD-10-CM

## 2023-08-23 DIAGNOSIS — G4733 Obstructive sleep apnea (adult) (pediatric): Secondary | ICD-10-CM | POA: Diagnosis not present

## 2023-08-25 DIAGNOSIS — G4733 Obstructive sleep apnea (adult) (pediatric): Secondary | ICD-10-CM | POA: Diagnosis not present

## 2023-09-24 DIAGNOSIS — G4733 Obstructive sleep apnea (adult) (pediatric): Secondary | ICD-10-CM | POA: Diagnosis not present

## 2023-09-29 DIAGNOSIS — Z961 Presence of intraocular lens: Secondary | ICD-10-CM | POA: Diagnosis not present

## 2023-09-29 DIAGNOSIS — H04123 Dry eye syndrome of bilateral lacrimal glands: Secondary | ICD-10-CM | POA: Diagnosis not present

## 2023-09-29 DIAGNOSIS — H40023 Open angle with borderline findings, high risk, bilateral: Secondary | ICD-10-CM | POA: Diagnosis not present

## 2023-10-19 ENCOUNTER — Ambulatory Visit: Payer: Self-pay

## 2023-10-19 NOTE — Telephone Encounter (Signed)
 E2C2 scheduled pt acute visit

## 2023-10-19 NOTE — Telephone Encounter (Signed)
   FYI Only or Action Required?: FYI only for provider.  Patient is followed in Pulmonology for OV FU, last seen on 07/22/2023 by Malachy Comer GAILS, NP.  Called Nurse Triage reporting Cough.  Symptoms began several months ago.  Interventions attempted: Prescription medications: Doxycycline 100 mg.  Symptoms are: unchanged.  Triage Disposition: See HCP Within 4 Hours (Or PCP Triage)  Patient/caregiver understands and will follow disposition?: Yes         Copied from CRM [id number]. Topic: Clinical - Red Word Triage >> Oct 19, 2023  8:56 AM Leila C wrote: Red Word that prompted transfer to Nurse Triage: Patient [phone] states having continue coughing clear phlegm since April, just finished antibiotics, tighteness in chest, headaches, head and chest congestion. Patient is unsure if the cpap machine is making the symptoms worse. Patient denies pain, shortness of breath, dizziness, nor fever. Lov 07/22/23 with NP, Cobb. Please advise. Reason for Disposition  Wheezing is present  Answer Assessment - Initial Assessment Questions 1. ONSET: When did the cough begin?      Pt reports intermittent since April 3. SPUTUM: Describe the color of your sputum (e.g., none, dry cough; clear, white, yellow, green)     Clear 4. HEMOPTYSIS: Are you coughing up any blood? If Yes, ask: How much? (e.g., flecks, streaks, tablespoons, etc.)     None 5. DIFFICULTY BREATHING: Are you having difficulty breathing? If Yes, ask: How bad is it? (e.g., mild, moderate, severe)      Little winded, chest is a little tighter 6. FEVER: Do you have a fever? If Yes, ask: What is your temperature, how was it measured, and when did it start?     None 8. LUNG HISTORY: Do you have any history of lung disease?  (e.g., pulmonary embolus, asthma, emphysema)     None 10. OTHER SYMPTOMS: Do you have any other symptoms? (e.g., runny nose, wheezing, chest pain)       Chest congestion, mild  wheezing  Protocols used: Cough - Acute Productive-A-AH

## 2023-10-20 ENCOUNTER — Encounter: Payer: Self-pay | Admitting: Pulmonary Disease

## 2023-10-20 ENCOUNTER — Ambulatory Visit: Admitting: Pulmonary Disease

## 2023-10-20 VITALS — BP 118/69 | HR 87 | Ht 65.0 in | Wt 127.0 lb

## 2023-10-20 DIAGNOSIS — G4733 Obstructive sleep apnea (adult) (pediatric): Secondary | ICD-10-CM | POA: Diagnosis not present

## 2023-10-20 DIAGNOSIS — J479 Bronchiectasis, uncomplicated: Secondary | ICD-10-CM

## 2023-10-20 DIAGNOSIS — R053 Chronic cough: Secondary | ICD-10-CM | POA: Diagnosis not present

## 2023-10-20 DIAGNOSIS — J4520 Mild intermittent asthma, uncomplicated: Secondary | ICD-10-CM

## 2023-10-20 DIAGNOSIS — R0982 Postnasal drip: Secondary | ICD-10-CM | POA: Diagnosis not present

## 2023-10-20 MED ORDER — FLUTICASONE PROPIONATE 50 MCG/ACT NA SUSP: 1.0000 | Freq: Every day | NASAL | 2 refills | Status: AC

## 2023-10-20 MED ORDER — IPRATROPIUM BROMIDE 0.03 % NA SOLN
2.0000 | Freq: Two times a day (BID) | NASAL | 12 refills | Status: AC
Start: 2023-10-20 — End: ?

## 2023-10-20 NOTE — Patient Instructions (Addendum)
 Start fluticasone  nasal spray, 1 spray per nostril daily  Start ipratropium nasal spray, 2 sprays per nostril twice daily  Start breo inhaler 1 puff daily - rinse mouth out after each use  Use saline nasal spray as needed to keep the nasal passages moist  If your cough does not improve in 4 weeks, please give us  a call.  Follow up as scheduled with Izetta Rouleau, NP

## 2023-10-20 NOTE — Progress Notes (Signed)
 Subjective:   PATIENT ID: Heather Mercado GENDER: female DOB: Aug 10, 1950, MRN: [id number]   HPI Discussed the use of AI scribe software for clinical note transcription with the patient, who gave verbal consent to proceed.  History of Present Illness   Heather Mercado is a 73 year old female with history of mild OSA on CPAP and mild bronchiectasis who presents with a persistent cough.  She has a persistent cough since a viral infection, which is intermittent and worsens when lying down, producing clear mucus. There is no chest pain, leg swelling, or hemoptysis. A similar cough occurred in the past with RSV and resolved over time.  A CT scan last year showed bronchiectasis. She experiences sinus drainage down the back of her throat, which she considers normal. There is no heartburn or reflux, and she has not used nasal sprays.  She was prescribed an inhaler but has not used it due to uncertainty about its necessity. Previous treatments with a Z-Pak and doxycycline were ineffective.   She has sleep apnea and has not used her CPAP machine for two nights, feeling better without it. She has experienced some nasal bleeding, attributing it to dryness or irritation.       Past Medical History:  Diagnosis Date   CAD in native artery 10/13/2022   History of breast cancer 12/25/2011   Right   History of radiation therapy    Osteopenia 02/2012   T score -1.9 stable from prior DEXA   Personal history of chemotherapy 05/2001   Personal history of radiation therapy 07/2001   Sleep apnea      Family History  Problem Relation Age of Onset   Breast cancer Mother 77   Atrial fibrillation Mother    Valvular heart disease Mother    Stroke Mother    Heart attack Father    Heart disease Father    Sudden death Sister    Colon cancer Maternal Aunt    Lung cancer Maternal Uncle      Social History   Socioeconomic History   Marital status: Married    Spouse name: Not on file    Number of children: 2   Years of education: Not on file   Highest education level: Not on file  Occupational History   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  Tobacco Use   Smoking status: Former    Current packs/day: 0.00    Average packs/day: 0.5 packs/day for 10.0 years (5.0 ttl pk-yrs)    Types: Cigarettes    Start date: 03/24/1972    Quit date: 03/24/1982    Years since quitting: 41.6   Smokeless tobacco: Never  Substance and Sexual Activity   Alcohol use: Yes    Alcohol/week: 4.0 standard drinks of alcohol    Types: 4 Standard drinks or equivalent per week    Comment: 4-5 glasses per week   Drug use: No   Sexual activity: Yes    Birth control/protection: Post-menopausal    Comment: 1st intercourse 73 yo--Fewer than 5 partners  Other Topics Concern   Not on file  Social History Narrative   Not on file   Social Drivers of Health   Financial Resource Strain: Not on file  Food Insecurity: Not on file  Transportation Needs: Not on file  Physical Activity: Not on file  Stress: Not on file  Social Connections: Not on file  Intimate Partner Violence: Not on file     Allergies  Allergen Reactions   Rosuvastatin  Other (  See Comments)    Developed mouth ulcers   Sulfa Antibiotics Rash     Outpatient Medications Prior to Visit  Medication Sig Dispense Refill   aspirin  EC 81 MG tablet Take 1 tablet (81 mg total) by mouth daily. Swallow whole.     atorvastatin  (LIPITOR) 20 MG tablet Take 1 tablet (20 mg total) by mouth daily. 90 tablet 0   Collagen-Vitamin C (COLLAGEN PLUS VITAMIN C PO) Take by mouth daily.     fish oil-omega-3 fatty acids 1000 MG capsule Take 1 g by mouth daily.       fluticasone  furoate-vilanterol (BREO ELLIPTA) 100-25 MCG/ACT AEPB Inhale 1 puff into the lungs daily.     glucosamine-chondroitin 500-400 MG tablet Take 1 tablet by mouth daily.     MAGNESIUM PO Take by mouth as needed.     Probiotic Product (PROBIOTIC PO) Take by mouth.     Pyridoxine HCl  (VITAMIN B-6 PO) Take by mouth daily.     doxycycline (VIBRAMYCIN) 100 MG capsule Take 100 mg by mouth 2 (two) times daily. (Patient not taking: Reported on 10/20/2023)     No facility-administered medications prior to visit.    Review of Systems  Constitutional:  Negative for chills, fever, malaise/fatigue and weight loss.  HENT:  Negative for congestion, sinus pain and sore throat.   Eyes: Negative.   Respiratory:  Positive for cough and sputum production. Negative for hemoptysis, shortness of breath and wheezing.   Cardiovascular:  Negative for chest pain, palpitations, orthopnea, claudication and leg swelling.  Gastrointestinal:  Negative for abdominal pain, heartburn, nausea and vomiting.  Genitourinary: Negative.   Musculoskeletal:  Negative for joint pain and myalgias.  Skin:  Negative for rash.  Neurological:  Negative for weakness.  Endo/Heme/Allergies: Negative.   Psychiatric/Behavioral: Negative.      Objective:   Vitals:   10/20/23 1336  BP: 118/69  Pulse: 87  SpO2: 96%  Weight: 127 lb (57.6 kg)  Height: 5' 5 (1.651 m)   Physical Exam Constitutional:      General: She is not in acute distress.    Appearance: Normal appearance.  Eyes:     General: No scleral icterus.    Conjunctiva/sclera: Conjunctivae normal.  Cardiovascular:     Rate and Rhythm: Normal rate and regular rhythm.  Pulmonary:     Breath sounds: No wheezing, rhonchi or rales.  Musculoskeletal:     Right lower leg: No edema.     Left lower leg: No edema.  Skin:    General: Skin is warm and dry.  Neurological:     General: No focal deficit present.    CBC    Component Value Date/Time   WBC 4.3 12/05/2020 1546   WBC 3.5 (L) 06/09/2014 1312   WBC 3.8 (L) 05/01/2014 1140   RBC 4.78 12/05/2020 1546   HGB 14.1 12/05/2020 1546   HGB 13.6 06/09/2014 1312   HCT 42.4 12/05/2020 1546   HCT 40.4 06/09/2014 1312   PLT 167 12/05/2020 1546   PLT 138 (L) 06/09/2014 1312   MCV 88.7 12/05/2020  1546   MCV 89.0 06/09/2014 1312   MCH 29.5 12/05/2020 1546   MCHC 33.3 12/05/2020 1546   RDW 14.2 12/05/2020 1546   RDW 13.9 06/09/2014 1312   LYMPHSABS 1.7 12/05/2020 1546   LYMPHSABS 1.6 06/09/2014 1312   MONOABS 0.3 12/05/2020 1546   MONOABS 0.3 06/09/2014 1312   EOSABS 0.1 12/05/2020 1546   EOSABS 0.1 06/09/2014 1312   BASOSABS 0.0 12/05/2020  1546   BASOSABS 0.0 06/09/2014 1312      Latest Ref Rng & Units 11/03/2022    3:33 PM 12/05/2020    3:46 PM 06/09/2014    1:12 PM  BMP  Glucose 70 - 99 mg/dL 92  90  87   BUN 8 - 27 mg/dL 20  13  82.5   Creatinine 0.57 - 1.00 mg/dL 9.27  9.20  0.7   BUN/Creat Ratio 12 - 28 28     Sodium 134 - 144 mmol/L 140  141  141   Potassium 3.5 - 5.2 mmol/L 4.1  4.3  3.9   Chloride 96 - 106 mmol/L 107  105    CO2 20 - 29 mmol/L 22  26  25    Calcium  8.7 - 10.3 mg/dL 8.9  9.5  8.9    Chest imaging: CT Chest 12/02/23 1. Similar appearance of clustered subcentimeter nodules in the superior segment of the left lower lobe. Largest nodule measures 4-5 mm. Small area of clustered bronchovascular nodularity in the right upper lobe. This is not previously included in the field of view. Findings are likely related to chronic atypical infection such as mycobacterium avium complex. Nodules are stable over the course of 6 weeks. No remote exams available for comparison. No follow-up needed if patient is low-risk (and has no known or suspected primary neoplasm). Non-contrast chest CT can be considered in 12 months if patient is high-risk. This recommendation follows the consensus statement: Guidelines for Management of Incidental Pulmonary Nodules Detected on CT Images: From the Fleischner Society 2017; Radiology 2017; 284:228-243. If eligible, consider annual lung cancer screening per Lung-RADS guidelines. 2. Areas of bronchiectasis most prominent in the lingula and right middle lobe. Scattered areas of mucoid impaction.  CXR 08/19/23 Nodular densities  in the right middle lobe and lingula corresponding to the bronchiectasis and chronic changes seen on the CT and likely sequela chronic mycobacterium infection. No new consolidation. There is no pleural effusion pneumothorax. The cardiac silhouette is within normal limits. No acute osseous pathology.  PFT:     No data to display          Labs:  Path:  Echo:  Heart Catheterization:       Assessment & Plan:   Chronic cough  Mild intermittent reactive airway disease without complication  Post-nasal drip - Plan: fluticasone  (FLONASE ) 50 MCG/ACT nasal spray, ipratropium (ATROVENT ) 0.03 % nasal spray  OSA (obstructive sleep apnea)  Bronchiectasis without complication (HCC) Assessment and Plan    Bronchiectasis with chronic productive cough Persistent cough with mucus production, especially when lying down. CT scan shows airway changes consistent with bronchiectasis. - Educated on Breo Ellipta inhaler, one puff daily in the morning, rinse mouth after use. - Consider nasal sprays for sinus drainage contributing to cough. - Reassess in 2-4 weeks if symptoms persist, consider further interventions.  Chronic sinus drainage contributing to cough Chronic sinus drainage worsened by lying down. Discussed nasal sprays to reduce drainage, potential for epistaxis. - Start Flonase , one spray per nostril daily. - Start ipratropium, two sprays per nostril twice daily. - Monitor for epistaxis, reduce nasal spray use if worsens. - Consider saline nasal spray for moisture.  Obstructive sleep apnea (currently not using CPAP) Obstructive sleep apnea, not using CPAP due to discomfort. Discussed temporary discontinuation while addressing cough and sinus issues. - Temporarily discontinue CPAP use while treating cough and sinus issues. - Plan to resume CPAP use after symptom improvement.      Follow up  as scheduled with Izetta Rouleau, NP  Dorn Chill, MD [facility] Office: [phone]   Current Outpatient Medications:    aspirin  EC 81 MG tablet, Take 1 tablet (81 mg total) by mouth daily. Swallow whole., Disp: , Rfl:    atorvastatin  (LIPITOR) 20 MG tablet, Take 1 tablet (20 mg total) by mouth daily., Disp: 90 tablet, Rfl: 0   Collagen-Vitamin C (COLLAGEN PLUS VITAMIN C PO), Take by mouth daily., Disp: , Rfl:    fish oil-omega-3 fatty acids 1000 MG capsule, Take 1 g by mouth daily.  , Disp: , Rfl:    fluticasone  (FLONASE ) 50 MCG/ACT nasal spray, Place 1 spray into both nostrils daily., Disp: 16 g, Rfl: 2   fluticasone  furoate-vilanterol (BREO ELLIPTA) 100-25 MCG/ACT AEPB, Inhale 1 puff into the lungs daily., Disp: , Rfl:    glucosamine-chondroitin 500-400 MG tablet, Take 1 tablet by mouth daily., Disp: , Rfl:    ipratropium (ATROVENT ) 0.03 % nasal spray, Place 2 sprays into both nostrils every 12 (twelve) hours., Disp: 30 mL, Rfl: 12   MAGNESIUM PO, Take by mouth as needed., Disp: , Rfl:    Probiotic Product (PROBIOTIC PO), Take by mouth., Disp: , Rfl:    Pyridoxine HCl (VITAMIN B-6 PO), Take by mouth daily., Disp: , Rfl:

## 2023-10-25 DIAGNOSIS — G4733 Obstructive sleep apnea (adult) (pediatric): Secondary | ICD-10-CM | POA: Diagnosis not present

## 2023-11-25 DIAGNOSIS — G4733 Obstructive sleep apnea (adult) (pediatric): Secondary | ICD-10-CM | POA: Diagnosis not present

## 2023-12-18 ENCOUNTER — Ambulatory Visit: Payer: Self-pay

## 2023-12-18 NOTE — Telephone Encounter (Signed)
 FYI Only or Action Required?: Action required by provider: request for appointment.  Patient was last seen in primary care on .  Called Nurse Triage reporting Cough.  Symptoms began several months ago.  Interventions attempted: SABRA  Symptoms are: unchanged.Cough for several months with blue mucus.  Triage Disposition: See PCP When Office is Open (Within 3 Days)  Patient/caregiver understands and will follow disposition?: Yes     Copied from CRM #[id number]. Topic: Clinical - Red Word Triage >> Dec 18, 2023  3:05 PM Corean SAUNDERS wrote: Red Word that prompted transfer to Nurse Triage: Blue phlegm from chronic cough. Answer Assessment - Initial Assessment Questions 1. ONSET: When did the cough begin?      April 2. SEVERITY: How bad is the cough today?      Aggravating  3. SPUTUM: Describe the color of your sputum (e.g., none, dry cough; clear, white, yellow, green)     blue 4. HEMOPTYSIS: Are you coughing up any blood? If Yes, ask: How much? (e.g., flecks, streaks, tablespoons, etc.)     no 5. DIFFICULTY BREATHING: Are you having difficulty breathing? If Yes, ask: How bad is it? (e.g., mild, moderate, severe)      no 6. FEVER: Do you have a fever? If Yes, ask: What is your temperature, how was it measured, and when did it start?     no 7. CARDIAC HISTORY: Do you have any history of heart disease? (e.g., heart attack, congestive heart failure)      no 8. LUNG HISTORY: Do you have any history of lung disease?  (e.g., pulmonary embolus, asthma, emphysema)     yes 9. PE RISK FACTORS: Do you have a history of blood clots? (or: recent major surgery, recent prolonged travel, bedridden)     no 10. OTHER SYMPTOMS: Do you have any other symptoms? (e.g., runny nose, wheezing, chest pain)       no 11. PREGNANCY: Is there any chance you are pregnant? When was your last menstrual period?       no 12. TRAVEL: Have you traveled out of the country in the last month?  (e.g., travel history, exposures)       no  Protocols used: Cough - Acute Productive-A-AH  Reason for Disposition  Cough has been present for > 3 weeks  Answer Assessment - Initial Assessment Questions 1. ONSET: When did the cough begin?      April 2. SEVERITY: How bad is the cough today?      Aggravating  3. SPUTUM: Describe the color of your sputum (e.g., none, dry cough; clear, white, yellow, green)     blue 4. HEMOPTYSIS: Are you coughing up any blood? If Yes, ask: How much? (e.g., flecks, streaks, tablespoons, etc.)     no 5. DIFFICULTY BREATHING: Are you having difficulty breathing? If Yes, ask: How bad is it? (e.g., mild, moderate, severe)      no 6. FEVER: Do you have a fever? If Yes, ask: What is your temperature, how was it measured, and when did it start?     no 7. CARDIAC HISTORY: Do you have any history of heart disease? (e.g., heart attack, congestive heart failure)      no 8. LUNG HISTORY: Do you have any history of lung disease?  (e.g., pulmonary embolus, asthma, emphysema)     yes 9. PE RISK FACTORS: Do you have a history of blood clots? (or: recent major surgery, recent prolonged travel, bedridden)     no 10. OTHER SYMPTOMS:  Do you have any other symptoms? (e.g., runny nose, wheezing, chest pain)       no 11. PREGNANCY: Is there any chance you are pregnant? When was your last menstrual period?       no 12. TRAVEL: Have you traveled out of the country in the last month? (e.g., travel history, exposures)       no  Protocols used: Cough - Acute Productive-A-AH

## 2023-12-21 NOTE — Telephone Encounter (Signed)
 Has appointment with Dr. Kara on 12/23/23.  Nothing further needed.

## 2023-12-23 ENCOUNTER — Encounter: Payer: Self-pay | Admitting: Pulmonary Disease

## 2023-12-23 ENCOUNTER — Other Ambulatory Visit (INDEPENDENT_AMBULATORY_CARE_PROVIDER_SITE_OTHER)

## 2023-12-23 ENCOUNTER — Ambulatory Visit: Admitting: Pulmonary Disease

## 2023-12-23 VITALS — BP 112/71 | HR 60 | Ht 60.0 in | Wt 127.0 lb

## 2023-12-23 DIAGNOSIS — J479 Bronchiectasis, uncomplicated: Secondary | ICD-10-CM | POA: Diagnosis not present

## 2023-12-23 DIAGNOSIS — R053 Chronic cough: Secondary | ICD-10-CM

## 2023-12-23 MED ORDER — PREDNISONE 10 MG PO TABS: ORAL_TABLET | ORAL | 0 refills | Status: AC

## 2023-12-23 MED ORDER — AZITHROMYCIN 250 MG PO TABS: ORAL_TABLET | ORAL | 0 refills | Status: DC

## 2023-12-23 NOTE — Patient Instructions (Addendum)
 Start prednisone taper 4 tabs daily x 3 days 3 tabs daily x 3 days 2 tabs daily x 3 days 1 tab daily x 3 days  Start Zpak antibiotic  We will check inflammatory labs today to evaluate your joint pains and bronchiectasis  We will schedule you for CT Chest scan  Follow up in 3 months, call sooner if needed

## 2023-12-23 NOTE — Progress Notes (Signed)
 Subjective:   PATIENT ID: Heather Mercado GENDER: female DOB: Jan 04, 1951, MRN: [id number]   HPI Discussed the use of AI scribe software for clinical note transcription with the patient, who gave verbal consent to proceed.  History of Present Illness         Heather Mercado is a 73 year old female with history of mild OSA on CPAP and mild bronchiectasis who presents with a persistent cough.    OV 10/20/23 She has a persistent cough since a viral infection, which is intermittent and worsens when lying down, producing clear mucus. There is no chest pain, leg swelling, or hemoptysis. A similar cough occurred in the past with RSV and resolved over time.   A CT scan last year showed bronchiectasis. She experiences sinus drainage down the back of her throat, which she considers normal. There is no heartburn or reflux, and she has not used nasal sprays.   She was prescribed an inhaler but has not used it due to uncertainty about its necessity. Previous treatments with a Z-Pak and doxycycline were ineffective.    She has sleep apnea and has not used her CPAP machine for two nights, feeling better without it. She has experienced some nasal bleeding, attributing it to dryness or irritation.   OV 12/23/23 No improvement in her cough. She used breo ellipta for 1 week and discontinued it as she did not find it helpful. She also stopped using her flonase  and ipratropium nasal sprays. She experiences a persistent cough with clear liquid production, especially when sedentary or lying down, occurring daily. The cough does not disturb her sleep. There is no wheezing, shortness of breath, heartburn, or reflux. The cough began after a respiratory virus in April. She does report dry eyes and mouth along with some scattered joint pains.  Past Medical History:  Diagnosis Date   CAD in native artery 10/13/2022   History of breast cancer 12/25/2011   Right   History of radiation therapy    Osteopenia  02/2012   T score -1.9 stable from prior DEXA   Personal history of chemotherapy 05/2001   Personal history of radiation therapy 07/2001   Sleep apnea      Family History  Problem Relation Age of Onset   Breast cancer Mother 88   Atrial fibrillation Mother    Valvular heart disease Mother    Stroke Mother    Heart attack Father    Heart disease Father    Sudden death Sister    Colon cancer Maternal Aunt    Lung cancer Maternal Uncle      Social History   Socioeconomic History   Marital status: Married    Spouse name: Not on file   Number of children: 2   Years of education: Not on file   Highest education level: Not on file  Occupational History   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  Tobacco Use   Smoking status: Former    Current packs/day: 0.00    Average packs/day: 0.5 packs/day for 10.0 years (5.0 ttl pk-yrs)    Types: Cigarettes    Start date: 03/24/1972    Quit date: 03/24/1982    Years since quitting: 41.7   Smokeless tobacco: Never  Substance and Sexual Activity   Alcohol use: Yes    Alcohol/week: 4.0 standard drinks of alcohol    Types: 4 Standard drinks or equivalent per week    Comment: 4-5 glasses per week   Drug use: No  Sexual activity: Yes    Birth control/protection: Post-menopausal    Comment: 1st intercourse 73 yo--Fewer than 5 partners  Other Topics Concern   Not on file  Social History Narrative   Not on file   Social Drivers of Health   Financial Resource Strain: Not on file  Food Insecurity: Not on file  Transportation Needs: Not on file  Physical Activity: Not on file  Stress: Not on file  Social Connections: Not on file  Intimate Partner Violence: Not on file     Allergies  Allergen Reactions   Rosuvastatin  Other (See Comments)    Developed mouth ulcers   Sulfa Antibiotics Rash     Outpatient Medications Prior to Visit  Medication Sig Dispense Refill   aspirin  EC 81 MG tablet Take 1 tablet (81 mg total) by mouth daily.  Swallow whole.     atorvastatin  (LIPITOR) 20 MG tablet Take 1 tablet (20 mg total) by mouth daily. 90 tablet 0   Collagen-Vitamin C (COLLAGEN PLUS VITAMIN C PO) Take by mouth daily.     fish oil-omega-3 fatty acids 1000 MG capsule Take 1 g by mouth daily.       fluticasone  (FLONASE ) 50 MCG/ACT nasal spray Place 1 spray into both nostrils daily. 16 g 2   fluticasone  furoate-vilanterol (BREO ELLIPTA) 100-25 MCG/ACT AEPB Inhale 1 puff into the lungs daily.     glucosamine-chondroitin 500-400 MG tablet Take 1 tablet by mouth daily.     ipratropium (ATROVENT ) 0.03 % nasal spray Place 2 sprays into both nostrils every 12 (twelve) hours. 30 mL 12   MAGNESIUM PO Take by mouth as needed.     Probiotic Product (PROBIOTIC PO) Take by mouth.     Pyridoxine HCl (VITAMIN B-6 PO) Take by mouth daily.     No facility-administered medications prior to visit.    Review of Systems  Constitutional:  Negative for chills, fever, malaise/fatigue and weight loss.  HENT:  Negative for congestion, sinus pain and sore throat.   Eyes: Negative.   Respiratory:  Positive for cough and sputum production. Negative for hemoptysis, shortness of breath and wheezing.   Cardiovascular:  Negative for chest pain, palpitations, orthopnea, claudication and leg swelling.  Gastrointestinal:  Negative for abdominal pain, heartburn, nausea and vomiting.  Genitourinary: Negative.   Musculoskeletal:  Negative for joint pain and myalgias.  Skin:  Negative for rash.  Neurological:  Negative for weakness.  Endo/Heme/Allergies: Negative.   Psychiatric/Behavioral: Negative.     Objective:   Vitals:   12/23/23 1103  BP: 112/71  Pulse: 60  SpO2: 94%  Weight: 127 lb (57.6 kg)  Height: 5' (1.524 m)   Physical Exam Constitutional:      General: She is not in acute distress.    Appearance: Normal appearance.  Eyes:     General: No scleral icterus.    Conjunctiva/sclera: Conjunctivae normal.  Cardiovascular:     Rate and  Rhythm: Normal rate and regular rhythm.  Pulmonary:     Breath sounds: No wheezing, rhonchi or rales.  Musculoskeletal:     Right lower leg: No edema.     Left lower leg: No edema.  Skin:    General: Skin is warm and dry.  Neurological:     General: No focal deficit present.       CBC    Component Value Date/Time   WBC 4.3 12/05/2020 1546   WBC 3.5 (L) 06/09/2014 1312   WBC 3.8 (L) 05/01/2014 1140   RBC 4.78  12/05/2020 1546   HGB 14.1 12/05/2020 1546   HGB 13.6 06/09/2014 1312   HCT 42.4 12/05/2020 1546   HCT 40.4 06/09/2014 1312   PLT 167 12/05/2020 1546   PLT 138 (L) 06/09/2014 1312   MCV 88.7 12/05/2020 1546   MCV 89.0 06/09/2014 1312   MCH 29.5 12/05/2020 1546   MCHC 33.3 12/05/2020 1546   RDW 14.2 12/05/2020 1546   RDW 13.9 06/09/2014 1312   LYMPHSABS 1.7 12/05/2020 1546   LYMPHSABS 1.6 06/09/2014 1312   MONOABS 0.3 12/05/2020 1546   MONOABS 0.3 06/09/2014 1312   EOSABS 0.1 12/05/2020 1546   EOSABS 0.1 06/09/2014 1312   BASOSABS 0.0 12/05/2020 1546   BASOSABS 0.0 06/09/2014 1312      Latest Ref Rng & Units 11/03/2022    3:33 PM 12/05/2020    3:46 PM 06/09/2014    1:12 PM  BMP  Glucose 70 - 99 mg/dL 92  90  87   BUN 8 - 27 mg/dL 20  13  82.5   Creatinine 0.57 - 1.00 mg/dL 9.27  9.20  0.7   BUN/Creat Ratio 12 - 28 28     Sodium 134 - 144 mmol/L 140  141  141   Potassium 3.5 - 5.2 mmol/L 4.1  4.3  3.9   Chloride 96 - 106 mmol/L 107  105    CO2 20 - 29 mmol/L 22  26  25    Calcium  8.7 - 10.3 mg/dL 8.9  9.5  8.9    Chest imaging: CT Chest 10/20/22 1. Similar appearance of clustered subcentimeter nodules in the superior segment of the left lower lobe. Largest nodule measures 4-5 mm. Small area of clustered bronchovascular nodularity in the right upper lobe. This is not previously included in the field of view. Findings are likely related to chronic atypical infection such as mycobacterium avium complex. Nodules are stable over the course of 6 weeks. No  remote exams available for comparison. No follow-up needed if patient is low-risk (and has no known or suspected primary neoplasm). Non-contrast chest CT can be considered in 12 months if patient is high-risk. This recommendation follows the consensus statement: Guidelines for Management of Incidental Pulmonary Nodules Detected on CT Images: From the Fleischner Society 2017; Radiology 2017; 284:228-243. If eligible, consider annual lung cancer screening per Lung-RADS guidelines. 2. Areas of bronchiectasis most prominent in the lingula and right middle lobe. Scattered areas of mucoid impaction.  PFT:     No data to display          Labs:  Path:  Echo:  Heart Catheterization:    Assessment & Plan:   Chronic cough  Bronchiectasis without complication (HCC) - Plan: CT Chest Wo Contrast, predniSONE (DELTASONE) 10 MG tablet, azithromycin (ZITHROMAX) 250 MG tablet, Anti-Smith antibody, ANA, Rheumatoid factor, Sjogrens syndrome-A extractable nuclear antibody, Sjogrens syndrome-B extractable nuclear antibody, CBC with Differential/Platelet, Comprehensive metabolic panel with GFR Assessment and Plan    Bronchiectasis without complication - Order updated chest CT scan to assess bronchiectasis progression. - Prescribe prednisone and azithromycin for current symptoms. - Instruct to use Flonase  as needed. - check limited autoimmune labs to rule out Sjogren's and RA - consider adding nebs and flutter valve for airway clearance based on CT chest  Follow up in 3 months  Dorn Chill, MD [facility] Office: [phone]   Current Outpatient Medications:    aspirin  EC 81 MG tablet, Take 1 tablet (81 mg total) by mouth daily. Swallow whole., Disp: ,  Rfl:    atorvastatin  (LIPITOR) 20 MG tablet, Take 1 tablet (20 mg total) by mouth daily., Disp: 90 tablet, Rfl: 0   azithromycin (ZITHROMAX) 250 MG tablet, Take as directed, Disp: 6 tablet, Rfl: 0    Collagen-Vitamin C (COLLAGEN PLUS VITAMIN C PO), Take by mouth daily., Disp: , Rfl:    fish oil-omega-3 fatty acids 1000 MG capsule, Take 1 g by mouth daily.  , Disp: , Rfl:    fluticasone  (FLONASE ) 50 MCG/ACT nasal spray, Place 1 spray into both nostrils daily., Disp: 16 g, Rfl: 2   fluticasone  furoate-vilanterol (BREO ELLIPTA) 100-25 MCG/ACT AEPB, Inhale 1 puff into the lungs daily., Disp: , Rfl:    glucosamine-chondroitin 500-400 MG tablet, Take 1 tablet by mouth daily., Disp: , Rfl:    ipratropium (ATROVENT ) 0.03 % nasal spray, Place 2 sprays into both nostrils every 12 (twelve) hours., Disp: 30 mL, Rfl: 12   MAGNESIUM PO, Take by mouth as needed., Disp: , Rfl:    predniSONE (DELTASONE) 10 MG tablet, Take 4 tablets (40 mg total) by mouth daily with breakfast for 3 days, THEN 3 tablets (30 mg total) daily with breakfast for 3 days, THEN 2 tablets (20 mg total) daily with breakfast for 3 days, THEN 1 tablet (10 mg total) daily with breakfast for 3 days., Disp: 30 tablet, Rfl: 0   Probiotic Product (PROBIOTIC PO), Take by mouth., Disp: , Rfl:    Pyridoxine HCl (VITAMIN B-6 PO), Take by mouth daily., Disp: , Rfl:

## 2023-12-24 LAB — COMPREHENSIVE METABOLIC PANEL WITH GFR
ALT: 20 U/L (ref 0–35)
AST: 23 U/L (ref 0–37)
Albumin: 4.1 g/dL (ref 3.5–5.2)
Alkaline Phosphatase: 50 U/L (ref 39–117)
BUN: 16 mg/dL (ref 6–23)
CO2: 26 meq/L (ref 19–32)
Calcium: 9.6 mg/dL (ref 8.4–10.5)
Chloride: 104 meq/L (ref 96–112)
Creatinine, Ser: 0.71 mg/dL (ref 0.40–1.20)
GFR: 85.23 mL/min (ref >60.00)
Glucose, Bld: 90 mg/dL (ref 70–99)
Potassium: 4.2 meq/L (ref 3.5–5.1)
Sodium: 138 meq/L (ref 135–145)
Total Bilirubin: 0.7 mg/dL (ref 0.2–1.2)
Total Protein: 7.1 g/dL (ref 6.0–8.3)

## 2023-12-24 LAB — CBC WITH DIFFERENTIAL/PLATELET
Basophils Absolute: 0.1 K/uL (ref 0.0–0.1)
Basophils Relative: 1.4 % (ref 0.0–3.0)
Eosinophils Absolute: 0.1 K/uL (ref 0.0–0.7)
Eosinophils Relative: 1.4 % (ref 0.0–5.0)
HCT: 43 % (ref 36.0–46.0)
Hemoglobin: 14.2 g/dL (ref 12.0–15.0)
Lymphocytes Relative: 28.7 % (ref 12.0–46.0)
Lymphs Abs: 1.2 K/uL (ref 0.7–4.0)
MCHC: 32.9 g/dL (ref 30.0–36.0)
MCV: 88.1 fl (ref 78.0–100.0)
Monocytes Absolute: 0.3 K/uL (ref 0.1–1.0)
Monocytes Relative: 6 % (ref 3.0–12.0)
Neutro Abs: 2.7 K/uL (ref 1.4–7.7)
Neutrophils Relative %: 62.5 % (ref 43.0–77.0)
Platelets: 158 K/uL (ref 150.0–400.0)
RBC: 4.88 Mil/uL (ref 3.87–5.11)
RDW: 15 % (ref 11.5–15.5)
WBC: 4.4 K/uL (ref 4.0–10.5)

## 2023-12-24 LAB — ANTI-SMITH ANTIBODY: ENA SM Ab Ser-aCnc: <1 AI

## 2023-12-27 LAB — SJOGRENS SYNDROME-B EXTRACTABLE NUCLEAR ANTIBODY: SSB (La) (ENA) Antibody, IgG: <1 AI

## 2023-12-27 LAB — ANTI-NUCLEAR AB-TITER (ANA TITER)
ANA TITER: 1:80 {titer} — ABNORMAL HIGH
ANA Titer 1: 1:80 {titer} — ABNORMAL HIGH

## 2023-12-27 LAB — RHEUMATOID FACTOR: Rheumatoid fact SerPl-aCnc: 10 [IU]/mL (ref <14)

## 2023-12-27 LAB — ANA: Anti Nuclear Antibody (ANA): POSITIVE — AB

## 2023-12-27 LAB — SJOGRENS SYNDROME-A EXTRACTABLE NUCLEAR ANTIBODY: SSA (Ro) (ENA) Antibody, IgG: <1 AI

## 2023-12-28 ENCOUNTER — Telehealth: Payer: Self-pay

## 2023-12-28 NOTE — Telephone Encounter (Signed)
 Tried to reach out to patient VM/LM okay per [facility] Provider has not had a chance to look at result, someone will reach out with those results   -NFN

## 2023-12-28 NOTE — Telephone Encounter (Unsigned)
 Copied from CRM [id number]. Topic: Clinical - Lab/Test Results >> Dec 28, 2023  8:42 AM Heather Mercado wrote: Reason for CRM:   Pt contacting clinic regarding lab results from 10/1. Requested call back to discuss results.   CB#  [phone]   Spoke w/ PT VBU. See you in 3 months !   -NFN

## 2023-12-30 NOTE — Telephone Encounter (Signed)
 Please let patient know that I have reviewed the lab results.   She has mild elevation in her ANA test which does not provide high concern for autoimmune activity. We can always repeat these labs in the future to see if they become more elevated.  The other option would be a referral to rheumatology for further evaluation and discussion.  Thanks, JD

## 2023-12-30 NOTE — Telephone Encounter (Signed)
 Spoke w/ PT VBU. See you in 3 months !     -NFN

## 2023-12-30 NOTE — Telephone Encounter (Signed)
 Pt calling back again this am, concerned about some abnormal test results.  Pt would appreciate if the dr will take a look at her latest test results and comment.

## 2024-01-18 ENCOUNTER — Ambulatory Visit (HOSPITAL_BASED_OUTPATIENT_CLINIC_OR_DEPARTMENT_OTHER)

## 2024-01-25 DIAGNOSIS — G4733 Obstructive sleep apnea (adult) (pediatric): Secondary | ICD-10-CM | POA: Diagnosis not present

## 2024-02-04 ENCOUNTER — Ambulatory Visit (HOSPITAL_BASED_OUTPATIENT_CLINIC_OR_DEPARTMENT_OTHER)
Admission: RE | Admit: 2024-02-04 | Discharge: 2024-02-04 | Disposition: A | Discharge status: Home / Self Care | Source: Ambulatory Visit | Attending: Pulmonary Disease | Admitting: Pulmonary Disease

## 2024-02-04 ENCOUNTER — Encounter (HOSPITAL_BASED_OUTPATIENT_CLINIC_OR_DEPARTMENT_OTHER): Payer: Self-pay

## 2024-02-04 DIAGNOSIS — J479 Bronchiectasis, uncomplicated: Secondary | ICD-10-CM | POA: Insufficient documentation

## 2024-02-04 DIAGNOSIS — C50911 Malignant neoplasm of unspecified site of right female breast: Secondary | ICD-10-CM | POA: Diagnosis not present

## 2024-02-08 ENCOUNTER — Telehealth: Payer: Self-pay

## 2024-02-08 NOTE — Telephone Encounter (Signed)
 Copied from CRM [id number]. Topic: Clinical - Lab/Test Results >> Feb 08, 2024  8:19 AM Benton KIDD wrote: Reason for CRM: patient is wanting to speak with nurse or doctor concerning her ct results [phone]  Dr. Kara can you please advise of CT results 11/13

## 2024-02-09 ENCOUNTER — Other Ambulatory Visit: Payer: Self-pay

## 2024-02-09 ENCOUNTER — Telehealth: Payer: Self-pay | Admitting: Pulmonary Disease

## 2024-02-09 DIAGNOSIS — R7689 Other specified abnormal immunological findings in serum: Secondary | ICD-10-CM

## 2024-02-09 DIAGNOSIS — M059 Rheumatoid arthritis with rheumatoid factor, unspecified: Secondary | ICD-10-CM

## 2024-02-09 NOTE — Telephone Encounter (Signed)
 Spoke with patient Heather Mercado.   States she's coughing up stuff she clear thin ( never has done that before )   PCP sent a referral to rheumatology.   Wants to know if she should come see you sooner  (?)

## 2024-02-09 NOTE — Telephone Encounter (Signed)
 Patient's CT scan is over stable compared to last years scan. She has findings of bronchiectasis and small nodules in her lungs. We will continue to monitor these findings in the future.  Thanks, Dorn Chill, MD [facility] Office: [phone]   See Amion for personal pager PCCM on call pager [phone] until 7pm. Please call Elink 7p-7a. [phone]

## 2024-02-09 NOTE — Telephone Encounter (Signed)
 Spoke with patient Referral has been sent   VBU.   -NFN

## 2024-02-09 NOTE — Progress Notes (Signed)
 Office Visit Note  Patient: Heather Mercado             Date of Birth: 04/04/1950           MRN: [id number]             PCP: Hughie Sharper, MD Referring: Hughie Sharper, MD Visit Date: 02/17/2024 Occupation: Data Unavailable  Subjective:  Positive ANA  History of Present Illness: Heather Mercado is a 73 y.o. female seen for the evaluation of positive ANA.  According the patient her symptoms started in 2003 with redness in her left eye and oral ulcers send then resolved.  She states since then she has had frequent upper respiratory tract infections requiring Z-Pak and intermittent prednisone  taper.  She states in April 2025 she went to [place] to a wedding and developed upper respiratory tract infection chronic cough which lingered on.  3 weeks ago she developed COVID-19 virus infection and was treated with Paxlovid.  She states her cough got worse and she was seen by Dr. Kara.  She had a CT scan which showed bronchiectasis and possibility of MAC.  She also had labs done which showed positive ANA.  She was referred to me for further evaluation of positive ANA.  She gives history of fatigue, occasional oral ulcers, dry mouth, dry eyes, redness in her left eye, recent joint pain in her left shoulder and her right knee which is getting better.  She denies any history of Raynaud's, malar rash, hair loss, inflammatory arthritis or lymphadenopathy.  There is no family history of autoimmune disease.  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  She is married, gravida 2, para 2.  There is no history of preeclampsia or DVTs.  She enjoys glass of wine every other night.  She smoked for 3 to 4 years in her 94s and quit smoking after that.  She is very active she walks enjoys biking Pilates lifting weights and reading.    Activities of Daily Living:  Patient reports morning stiffness for a few minutes.   Patient Denies nocturnal pain.  Difficulty dressing/grooming:  Denies Difficulty climbing stairs: Denies Difficulty getting out of chair: Denies Difficulty using hands for taps, buttons, cutlery, and/or writing: Denies  Review of Systems  Constitutional:  Positive for fatigue.  HENT:  Positive for mouth sores and mouth dryness.   Eyes:  Positive for photophobia, redness and dryness. Negative for pain and visual disturbance.  Respiratory:  Negative for shortness of breath.   Cardiovascular:  Negative for chest pain and palpitations.  Gastrointestinal:  Positive for constipation. Negative for blood in stool and diarrhea.  Endocrine: Negative for increased urination.  Genitourinary:  Negative for involuntary urination.  Musculoskeletal:  Positive for joint pain, joint pain and morning stiffness. Negative for gait problem, joint swelling, myalgias, muscle weakness, muscle tenderness and myalgias.  Skin:  Positive for sensitivity to sunlight. Negative for color change, rash and hair loss.  Allergic/Immunologic: Positive for susceptible to infections.  Neurological:  Negative for dizziness and headaches.  Hematological:  Negative for swollen glands.  Psychiatric/Behavioral:  Negative for depressed mood and sleep disturbance. The patient is nervous/anxious.     PMFS History:  Patient Active Problem List   Diagnosis Date Noted   CAD in native artery 10/13/2022   Atypical chest pain 10/13/2022   Breast cancer, right breast (HCC) 06/11/2014   OSA (obstructive sleep apnea) 05/01/2014   History of breast cancer 12/25/2011   Osteopenia 12/25/2011  Past Medical History:  Diagnosis Date   CAD in native artery 10/13/2022   History of breast cancer 12/25/2011   Right   History of radiation therapy    Osteopenia 02/2012   T score -1.9 stable from prior DEXA   Personal history of chemotherapy 05/2001   Personal history of radiation therapy 07/2001   Sleep apnea     Family History  Problem Relation Age of Onset   Breast cancer Mother 55   Atrial  fibrillation Mother    Valvular heart disease Mother    Stroke Mother    Lung cancer Mother    Heart attack Father    Heart disease Father    Heart disease Sister    Sudden death Sister    Stroke Brother    Colon cancer Maternal Aunt    Lung cancer Maternal Uncle    Thyroid  disease Son    Healthy Son    Past Surgical History:  Procedure Laterality Date   ACHILLES TENDON REPAIR  1999   BREAST BIOPSY Right 04/19/2001   BREAST LUMPECTOMY Right 04/27/2001   right   CESAREAN SECTION  1986   COSMETIC SURGERY     Eye lids   DILATION AND CURETTAGE OF UTERUS  2004   endometrial polyp   Social History   Tobacco Use   Smoking status: Former    Current packs/day: 0.00    Average packs/day: 0.5 packs/day for 10.0 years (5.0 ttl pk-yrs)    Types: Cigarettes    Start date: 03/24/1972    Quit date: 03/24/1982    Years since quitting: 41.9    Passive exposure: Never   Smokeless tobacco: Never  Vaping Use   Vaping status: Never Used  Substance Use Topics   Alcohol use: Yes    Alcohol/week: 4.0 standard drinks of alcohol    Types: 4 Standard drinks or equivalent per week    Comment: 4-5 glasses per week (red wine)   Drug use: No   Social History   Social History Narrative   Not on file     Immunization History  Administered Date(s) Administered   Influenza Split 01/10/2015   Influenza-Unspecified 12/22/2013   Moderna SARS-COV2 Booster Vaccination 02/03/2020     Objective: Vital Signs: BP 118/78 (BP Location: Left Arm, Patient Position: Sitting, Cuff Size: Normal)   Pulse 66   Temp 98.3 F (36.8 C)   Resp 14   Ht 5' 5 (1.651 m)   Wt 129 lb (58.5 kg)   BMI 21.47 kg/m    Physical Exam Vitals and nursing note reviewed.  Constitutional:      Appearance: She is well-developed.  HENT:     Head: Normocephalic and atraumatic.  Eyes:     Conjunctiva/sclera: Conjunctivae normal.  Cardiovascular:     Rate and Rhythm: Normal rate and regular rhythm.     Heart sounds:  Normal heart sounds.  Pulmonary:     Effort: Pulmonary effort is normal.     Breath sounds: Normal breath sounds.  Abdominal:     General: Bowel sounds are normal.     Palpations: Abdomen is soft.  Musculoskeletal:     Cervical back: Normal range of motion.  Lymphadenopathy:     Cervical: No cervical adenopathy.  Skin:    General: Skin is warm and dry.     Capillary Refill: Capillary refill takes less than 2 seconds.  Neurological:     Mental Status: She is alert and oriented to person, place, and time.  Psychiatric:        Behavior: Behavior normal.      Musculoskeletal Exam: Cervical, thoracic and lumbar spine were in good range of motion.  Thoracic dextroscoliosis was noted.  There was no SI joint tenderness.  Shoulder joints, elbow joints, wrist joints, MCPs, PIPs and DIPs were in good range of motion with no synovitis.  Bilateral PIP and DIP thickening was noted.  Hip joints and knee joints were in good range of motion without any warmth swelling or effusion.  There was no tenderness over ankles or MTPs.   CDAI Exam: CDAI Score: -- Patient Global: --; Provider Global: -- Swollen: --; Tender: -- Joint Exam 02/17/2024   No joint exam has been documented for this visit   There is currently no information documented on the homunculus. Go to the Rheumatology activity and complete the homunculus joint exam.  Investigation: No additional findings.  Imaging: CT Chest Wo Contrast Result Date: 02/06/2024 CLINICAL DATA:  Bronchiectasis, follow-up. Coughing up mucus for many months. History of right breast cancer EXAM: CT CHEST WITHOUT CONTRAST TECHNIQUE: Multidetector CT imaging of the chest was performed following the standard protocol without IV contrast. RADIATION DOSE REDUCTION: This exam was performed according to the departmental dose-optimization program which includes automated exposure control, adjustment of the mA and/or kV according to patient size and/or use of iterative  reconstruction technique. COMPARISON:  12/02/2022. FINDINGS: Cardiovascular: The heart is normal in size and there is a trace pericardial effusion. Scattered coronary artery calcifications are noted. There is atherosclerotic calcification of the aorta without evidence of aneurysm. The pulmonary trunk is normal in caliber. Mediastinum/Nodes: No mediastinal or axillary lymphadenopathy is seen. Evaluation of hila is limited due to lack of IV contrast. The thyroid  gland, trachea, and esophagus are within normal limits. Lungs/Pleura: Pleuroparenchymal scarring is present at the lung apices. Bronchiectasis is present bilaterally with a right upper and middle lobe and left upper lobe predominance. Patchy airspace disease is noted in the right middle and left upper lobes, similar in appearance to the prior exam. No effusion or pneumothorax is seen. There are new pulmonary nodules in the right middle lobe measuring 3 mm, axial image 64 in 4 mm, axial image 66. Tree-in-bud nodularity is present in the right middle lobe and lingular segment of the left upper lobe. There is a nodular opacity in the posterior segment of the left upper lobe measuring 5 mm, axial image 49, slightly improved from the prior exam. Left lower lobe pulmonary nodules have decreased in size from the prior exam, axial image 69. Upper Abdomen: Scattered cysts are present in the liver, the largest measuring 9.5 cm in the left lobe. No acute abnormality. Musculoskeletal: Degenerative changes are present in the thoracic spine. No acute osseous abnormality is seen. IMPRESSION: 1. Bronchiectasis with tree-in-bud nodularity bilaterally, most pronounced in the right middle and upper lobes. Decreased nodularity is noted in the left lower lobe and new nodularity is present in the right upper lobe, suggesting a waxing and waning nodularity related to chronic atypical infection such is mycobacterium avium complex. No follow-up needed if patient is low-risk (and has  no known or suspected primary neoplasm). Non-contrast chest CT can be considered in 12 months if patient is high-risk. This recommendation follows the consensus statement: Guidelines for Management of Incidental Pulmonary Nodules Detected on CT Images: From the Fleischner Society 2017; Radiology 2017; 284:228-243. 2. Aortic atherosclerosis. 3. Coronary artery calcifications. Electronically Signed   By: Leita Birmingham M.D.   On: 02/06/2024 15:59  Recent Labs: Lab Results  Component Value Date   WBC 4.4 12/23/2023   HGB 14.2 12/23/2023   PLT 158.0 12/23/2023   NA 138 12/23/2023   K 4.2 12/23/2023   CL 104 12/23/2023   CO2 26 12/23/2023   GLUCOSE 90 12/23/2023   BUN 16 12/23/2023   CREATININE 0.71 12/23/2023   BILITOT 0.7 12/23/2023   ALKPHOS 50 12/23/2023   AST 23 12/23/2023   ALT 20 12/23/2023   PROT 7.1 12/23/2023   ALBUMIN 4.1 12/23/2023   CALCIUM  9.6 12/23/2023   December 23, 2023 ANA 1: 80 cytoplasmic, 1: 80 mitotic, Smith negative, SSA negative, SSB negative, RF 10   Speciality Comments: No specialty comments available.  Procedures:  No procedures performed Allergies: Rosuvastatin  and Sulfa antibiotics   Assessment / Plan:     Visit Diagnoses: Positive ANA (antinuclear antibody) -patient was found to have low titer positive ANA during recent pulmonary workup.  She gives history of fatigue, infrequent oral ulcers, sicca symptoms, photosensitivity.  There is no history of Raynaud's phenomenon, malar rash, lymphadenopathy or inflammatory arthritis.  Labs obtained on December 23, 2023 ANA 1: 80 cytoplasmic, 1: 80 mitotic, Smith negative, SSA negative, SSB negative, RF 10.  Lab results were reviewed with the patient.  Prevalence of positive ANA in the normal population was discussed.  I will obtain some additional labs today.  Patient states she gets canker sores in her mouth when she is sick.  Plan: Protein / creatinine ratio, urine, ANA, Anti-scleroderma antibody, RNP Antibody,  Anti-DNA antibody, double-stranded, C3 and C4, Beta-2 glycoprotein antibodies, Cardiolipin antibodies, IgG, IgM, IgA  Dry mouth-she gives history of dry mouth and some times dry eyes.  ANA was low titer positive, SSA and SSB were negative.  Complements were normal.  Acute pain of left shoulder-she had recent discomfort in her left shoulder which is gradually getting better.  She had good range of motion of her shoulders.  Primary osteoarthritis of both hands-no synovitis was noted.  Patient denies any discomfort.  Bilateral PIP and DIP thickening was noted.  Patient states she does not have any symptoms.  A handout on hand exercises was given.  Pes cavus of both feet-she has bilateral pes cavus and hammertoes.  She has been using orthotics.  She denies any discomfort.  Osteopenia of multiple sites - July 7, 2021The BMD measured at [facility] L3-L4 is 0.943 g/cm2 with a T-score of-2.1.  She has been exercising on a regular basis.  She consumes calcium  rich diet and vitamin D .  CAD in native artery  History of breast cancer - 2003, lumpectomy, requiring CTX and RTX.  Bronchiectasis without complication (HCC) -she gives history of recurrent cough since 2003.  She states she gets Z-Pak or prednisone  Dosepak for severe bronchitis.  She was evaluated by Dr. Kara on December 23, 2023 and diagnosed with bronchiectasis after the CT scan.  She was treated with Z-Pak and prednisone .  The labs showed positive ANA for that reason she was referred to me.  Plan: QuantiFERON-TB Gold Plus  COVID-19 virus infection-patient had COVID-19 virus infection about 3 weeks ago which was treated with Paxlovid.  She states she had worsening cough since then.  OSA (obstructive sleep apnea) - on CPAP  Orders: Orders Placed This Encounter  Procedures   Protein / creatinine ratio, urine   ANA   Anti-scleroderma antibody   RNP Antibody   Anti-DNA antibody, double-stranded   C3 and C4   Beta-2 glycoprotein antibodies  Cardiolipin antibodies, IgG, IgM, IgA   QuantiFERON-TB Gold Plus   No orders of the defined types were placed in this encounter.    Follow-Up Instructions: Return for +ANA.   Maya Nash, MD  Note - This record has been created using Animal nutritionist.  Chart creation errors have been sought, but may not always  have been located. Such creation errors do not reflect on  the standard of medical care.

## 2024-02-09 NOTE — Telephone Encounter (Signed)
 Copied from CRM #[id number]. Topic: Referral - Question >> Feb 09, 2024  1:53 PM Lavanda D wrote: Reason for CRM: Patient would like to leave a message for Dr. Kara or his nurse. She is scheduled with a rheumatologist this coming Wednesday. She would like to make sure the referral has been sent to their office. She would also like to make sure that the rheumatologist will have all of the information from Dr. Kara regarding her CT/Blood work.

## 2024-02-17 ENCOUNTER — Encounter: Payer: Self-pay | Admitting: Rheumatology

## 2024-02-17 ENCOUNTER — Ambulatory Visit: Attending: Rheumatology | Admitting: Rheumatology

## 2024-02-17 ENCOUNTER — Encounter: Payer: Self-pay | Admitting: Pulmonary Disease

## 2024-02-17 ENCOUNTER — Ambulatory Visit: Admitting: Pulmonary Disease

## 2024-02-17 VITALS — BP 118/78 | HR 66 | Temp 98.3°F | Resp 14 | Ht 65.0 in | Wt 129.0 lb

## 2024-02-17 VITALS — BP 112/64 | HR 72 | Ht 65.0 in | Wt 130.0 lb

## 2024-02-17 DIAGNOSIS — G4733 Obstructive sleep apnea (adult) (pediatric): Secondary | ICD-10-CM

## 2024-02-17 DIAGNOSIS — M25512 Pain in left shoulder: Secondary | ICD-10-CM | POA: Diagnosis not present

## 2024-02-17 DIAGNOSIS — U071 COVID-19: Secondary | ICD-10-CM

## 2024-02-17 DIAGNOSIS — Q6671 Congenital pes cavus, right foot: Secondary | ICD-10-CM | POA: Diagnosis not present

## 2024-02-17 DIAGNOSIS — R682 Dry mouth, unspecified: Secondary | ICD-10-CM

## 2024-02-17 DIAGNOSIS — M8589 Other specified disorders of bone density and structure, multiple sites: Secondary | ICD-10-CM

## 2024-02-17 DIAGNOSIS — I2510 Atherosclerotic heart disease of native coronary artery without angina pectoris: Secondary | ICD-10-CM

## 2024-02-17 DIAGNOSIS — M19041 Primary osteoarthritis, right hand: Secondary | ICD-10-CM

## 2024-02-17 DIAGNOSIS — J479 Bronchiectasis, uncomplicated: Secondary | ICD-10-CM

## 2024-02-17 DIAGNOSIS — M19042 Primary osteoarthritis, left hand: Secondary | ICD-10-CM | POA: Diagnosis not present

## 2024-02-17 DIAGNOSIS — R7689 Other specified abnormal immunological findings in serum: Secondary | ICD-10-CM | POA: Diagnosis not present

## 2024-02-17 DIAGNOSIS — Z853 Personal history of malignant neoplasm of breast: Secondary | ICD-10-CM | POA: Diagnosis not present

## 2024-02-17 DIAGNOSIS — Q6672 Congenital pes cavus, left foot: Secondary | ICD-10-CM

## 2024-02-17 MED ORDER — BENZONATATE 200 MG PO CAPS: 200.0000 mg | ORAL_CAPSULE | Freq: Three times a day (TID) | ORAL | 1 refills | Status: AC | PRN

## 2024-02-17 MED ORDER — SODIUM CHLORIDE 3 % IN NEBU: INHALATION_SOLUTION | RESPIRATORY_TRACT | 3 refills | Status: DC | PRN

## 2024-02-17 MED ORDER — ALBUTEROL SULFATE (2.5 MG/3ML) 0.083% IN NEBU: 2.5000 mg | INHALATION_SOLUTION | Freq: Two times a day (BID) | RESPIRATORY_TRACT | 5 refills | Status: DC

## 2024-02-17 MED ORDER — ALBUTEROL SULFATE (2.5 MG/3ML) 0.083% IN NEBU: 2.5000 mg | INHALATION_SOLUTION | Freq: Two times a day (BID) | RESPIRATORY_TRACT | 5 refills | Status: AC

## 2024-02-17 NOTE — Patient Instructions (Signed)

## 2024-02-17 NOTE — Patient Instructions (Signed)
 Sputum for Mycobacterium  DME for a nebulizer  - Hypertonic saline to help make mucus thinner to be cleared  DME to provide flutter device/Acapella -This should be used after your nebulization to help clear the mucus  Benzonatate  for cough  Follow-up in about 8 weeks to see how you doing

## 2024-02-17 NOTE — Progress Notes (Signed)
 Heather Mercado    [id number]    1950/12/13  Primary Care Physician:Hilts, Ozell, MD  Referring Physician: Hughie Ozell, MD [address] [place],  [place] [address]  Chief complaint:   Patient being seen for chronic cough  HPI:  Was recently treated with a course of antibiotics Cough has persisted Not feeling acutely ill at present  Recently had a CT scan showing evidence of bronchiectasis which she came in to discuss today  Was recently seen by Dr. Kara -The cough became more persistent and more noticeable following a viral infection, was treated with antibiotics and steroids but never improved completely Recently seen by rheumatology for positive ANA-workup ongoing  Past history of breast cancer History of obstructive sleep apnea  She coughs regularly with sputum production Denies any chest pains or chest discomfort  Her weight has been stable  Does have some fatigue Outpatient Encounter Medications as of 02/17/2024  Medication Sig   aspirin  EC 81 MG tablet Take 1 tablet (81 mg total) by mouth daily. Swallow whole.   atorvastatin  (LIPITOR) 20 MG tablet Take 1 tablet (20 mg total) by mouth daily.   benzonatate  (TESSALON ) 200 MG capsule Take 1 capsule (200 mg total) by mouth 3 (three) times daily as needed for cough.   fish oil-omega-3 fatty acids 1000 MG capsule Take 1 g by mouth daily.     fluticasone  (FLONASE ) 50 MCG/ACT nasal spray Place 1 spray into both nostrils daily.   fluticasone  furoate-vilanterol (BREO ELLIPTA) 100-25 MCG/ACT AEPB Inhale 1 puff into the lungs daily. (Patient taking differently: Inhale 1 puff into the lungs as needed.)   glucosamine-chondroitin 500-400 MG tablet Take 1 tablet by mouth daily.   ipratropium (ATROVENT ) 0.03 % nasal spray Place 2 sprays into both nostrils every 12 (twelve) hours.   MAGNESIUM PO Take by mouth as needed.   Probiotic Product (PROBIOTIC PO) Take by mouth.   Pyridoxine HCl (VITAMIN B-6  PO) Take by mouth daily.   sodium chloride  HYPERTONIC 3 % nebulizer solution Take by nebulization as needed for other.   Zinc Oxide-Vitamin C (ZINC PLUS VITAMIN C PO) Take by mouth. W/ vitamin D    [DISCONTINUED] albuterol  (PROVENTIL ) (2.5 MG/3ML) 0.083% nebulizer solution Take 3 mLs (2.5 mg total) by nebulization in the morning and at bedtime.   albuterol  (PROVENTIL ) (2.5 MG/3ML) 0.083% nebulizer solution Take 3 mLs (2.5 mg total) by nebulization in the morning and at bedtime.   No facility-administered encounter medications on file as of 02/17/2024.    Allergies as of 02/17/2024 - Review Complete 02/17/2024  Allergen Reaction Noted   Rosuvastatin  Other (See Comments) 07/08/2023   Sulfa antibiotics Rash 04/06/2013    Past Medical History:  Diagnosis Date   CAD in native artery 10/13/2022   History of breast cancer 12/25/2011   Right   History of radiation therapy    Osteopenia 02/2012   T score -1.9 stable from prior DEXA   Personal history of chemotherapy 05/2001   Personal history of radiation therapy 07/2001   Sleep apnea     Past Surgical History:  Procedure Laterality Date   ACHILLES TENDON REPAIR  1999   BREAST BIOPSY Right 04/19/2001   BREAST LUMPECTOMY Right 04/27/2001   right   CESAREAN SECTION  1986   COSMETIC SURGERY     Eye lids   DILATION AND CURETTAGE OF UTERUS  2004   endometrial polyp    Family History  Problem Relation Age of  Onset   Breast cancer Mother 51   Atrial fibrillation Mother    Valvular heart disease Mother    Stroke Mother    Lung cancer Mother    Heart attack Father    Heart disease Father    Heart disease Sister    Sudden death Sister    Stroke Brother    Colon cancer Maternal Aunt    Lung cancer Maternal Uncle    Thyroid  disease Son    Healthy Son     Social History   Socioeconomic History   Marital status: Married    Spouse name: Not on file   Number of children: 2   Years of education: Not on file   Highest education  level: Not on file  Occupational History   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  Tobacco Use   Smoking status: Former    Current packs/day: 0.00    Average packs/day: 0.5 packs/day for 10.0 years (5.0 ttl pk-yrs)    Types: Cigarettes    Start date: 03/24/1972    Quit date: 03/24/1982    Years since quitting: 41.9    Passive exposure: Never   Smokeless tobacco: Never  Vaping Use   Vaping status: Never Used  Substance and Sexual Activity   Alcohol use: Yes    Alcohol/week: 4.0 standard drinks of alcohol    Types: 4 Standard drinks or equivalent per week    Comment: 4-5 glasses per week (red wine)   Drug use: No   Sexual activity: Yes    Birth control/protection: Post-menopausal    Comment: 1st intercourse 73 yo--Fewer than 5 partners  Other Topics Concern   Not on file  Social History Narrative   Not on file   Social Drivers of Health   Financial Resource Strain: Not on file  Food Insecurity: Not on file  Transportation Needs: Not on file  Physical Activity: Not on file  Stress: Not on file  Social Connections: Not on file  Intimate Partner Violence: Not on file    Review of Systems  Constitutional:  Positive for fatigue.  Respiratory:  Positive for apnea and cough.     Vitals:   02/17/24 1444  BP: 112/64  Pulse: 72  SpO2: 99%     Physical Exam Constitutional:      Appearance: Normal appearance.  HENT:     Head: Normocephalic.     Right Ear: Tympanic membrane normal.     Nose: Nose normal.     Mouth/Throat:     Mouth: Mucous membranes are moist.  Eyes:     General: No scleral icterus.    Pupils: Pupils are equal, round, and reactive to light.  Cardiovascular:     Rate and Rhythm: Normal rate and regular rhythm.     Heart sounds: No murmur heard.    No friction rub.  Pulmonary:     Effort: No respiratory distress.     Breath sounds: No stridor. No wheezing or rhonchi.  Musculoskeletal:     Cervical back: Normal range of motion. No rigidity or tenderness.   Neurological:     Mental Status: She is alert.  Psychiatric:        Mood and Affect: Mood normal.    Data Reviewed: CT chest 02/04/2024 reviewed with the patient showing evidence of bronchiectasis, some nodular changes    Assessment/Plan: Bronchiectasis with exacerbation  DME referral for nebulizer, order for hypertonic saline placed with albuterol   Acapella device for secretion clearance Prescription for benzonatate  called in  for cough  Follow-up in about 8 weeks  Encouraged to call with significant concerns  Order sputum mycobacterial stain and culture  Bronchoscopy discussed with the patient as an optional further evaluation if not producing significant sputum  History of coronary artery disease - Stable  Past history of breast cancer  Obstructive sleep apnea - Compliant with CPAP Stable symptoms   Jennet Epley MD [facility] 02/17/2024, 7:37 PM  CC: Hughie Sharper, MD

## 2024-02-21 LAB — ANTI-NUCLEAR AB-TITER (ANA TITER)
ANA TITER: 1:40 {titer} — ABNORMAL HIGH
ANA Titer 1: 1:80 {titer} — ABNORMAL HIGH

## 2024-02-21 LAB — QUANTIFERON-TB GOLD PLUS
Mitogen-NIL: 6.36 [IU]/mL
NIL: 0.01 [IU]/mL
QuantiFERON-TB Gold Plus: NEGATIVE
TB1-NIL: 0 [IU]/mL
TB2-NIL: 0 [IU]/mL

## 2024-02-21 LAB — C3 AND C4
C3 Complement: 107 mg/dL (ref 83–193)
C4 Complement: 27 mg/dL (ref 15–57)

## 2024-02-21 LAB — PROTEIN / CREATININE RATIO, URINE
Creatinine, Urine: 28 mg/dL (ref 20–275)
Total Protein, Urine: <4 mg/dL — ABNORMAL LOW (ref 5–24)

## 2024-02-21 LAB — ANA: Anti Nuclear Antibody (ANA): POSITIVE — AB

## 2024-02-21 LAB — BETA-2 GLYCOPROTEIN ANTIBODIES
Beta-2 Glyco 1 IgA: 9.7 U/mL (ref <20.0)
Beta-2 Glyco 1 IgM: 10.2 U/mL (ref <20.0)
Beta-2 Glyco I IgG: <2 U/mL (ref <20.0)

## 2024-02-21 LAB — CARDIOLIPIN ANTIBODIES, IGG, IGM, IGA
Anticardiolipin IgA: 4.1 [APL'U]/mL (ref <20.0)
Anticardiolipin IgG: <2 [GPL'U]/mL (ref <20.0)
Anticardiolipin IgM: 11.3 [MPL'U]/mL (ref <20.0)

## 2024-02-21 LAB — ANTI-SCLERODERMA ANTIBODY: Scleroderma (Scl-70) (ENA) Antibody, IgG: <1 AI

## 2024-02-21 LAB — RNP ANTIBODY: Ribonucleic Protein(ENA) Antibody, IgG: <1 AI

## 2024-02-21 LAB — ANTI-DNA ANTIBODY, DOUBLE-STRANDED: ds DNA Ab: <1 [IU]/mL

## 2024-02-22 ENCOUNTER — Ambulatory Visit: Payer: Self-pay | Admitting: Rheumatology

## 2024-02-23 ENCOUNTER — Telehealth: Payer: Self-pay | Admitting: *Deleted

## 2024-02-23 MED ORDER — SODIUM CHLORIDE 3 % IN NEBU: INHALATION_SOLUTION | RESPIRATORY_TRACT | 3 refills | Status: AC | PRN

## 2024-02-23 NOTE — Telephone Encounter (Signed)
 Copied from CRM #[id number]. Topic: General - Other >> Feb 17, 2024  3:43 PM Rilla B wrote: Reason for CRM: Pharmacist from [facility] calling regarding script for sodium cholride Hypertonic sent over today.  Need Clarification...  Quantity does not tell how much to use nor how often. ..  NDC number is for 15ml size unit dose size.  Need clarification... size, how much, how often.SABRA  Phone: [phone] FAX: [phone].  New order for clarification sent over.  Nothing further needed.

## 2024-02-24 DIAGNOSIS — G4733 Obstructive sleep apnea (adult) (pediatric): Secondary | ICD-10-CM | POA: Diagnosis not present

## 2024-02-25 DIAGNOSIS — G4733 Obstructive sleep apnea (adult) (pediatric): Secondary | ICD-10-CM | POA: Diagnosis not present

## 2024-02-25 NOTE — Progress Notes (Signed)
 I called patient today and discussed the results.  Patient states she was recently seen by Dr. Neda for bronchiectasis exacerbation.  She will be getting further workup.  I advised her to contact me if I could be of any further help.

## 2024-02-26 ENCOUNTER — Telehealth: Payer: Self-pay

## 2024-02-26 DIAGNOSIS — J479 Bronchiectasis, uncomplicated: Secondary | ICD-10-CM

## 2024-02-26 NOTE — Telephone Encounter (Signed)
 Copied from CRM #[id number]. Topic: Clinical - Order For Equipment >> Feb 24, 2024 11:11 AM Essie A wrote: Reason for CRM: Patient is calling today to get the status of her DME equipment, a nebulizer, in order to use her medicine with.  The DME has not contacted her at this point.  Please return her call at [phone].  Thanks.    ATC pt, left detailed message per DPR.  Per LOV notes: DME referral for nebulizer   Nebulizer order was not placed. Order has now been placed, NFN.

## 2024-03-02 DIAGNOSIS — J479 Bronchiectasis, uncomplicated: Secondary | ICD-10-CM | POA: Diagnosis not present

## 2024-04-04 ENCOUNTER — Encounter: Payer: Self-pay | Admitting: Pulmonary Disease

## 2024-04-04 ENCOUNTER — Ambulatory Visit: Admitting: Pulmonary Disease

## 2024-04-04 VITALS — BP 127/72 | HR 59 | Ht 65.0 in | Wt 128.4 lb

## 2024-04-04 DIAGNOSIS — J479 Bronchiectasis, uncomplicated: Secondary | ICD-10-CM

## 2024-04-04 DIAGNOSIS — R053 Chronic cough: Secondary | ICD-10-CM | POA: Diagnosis not present

## 2024-04-04 DIAGNOSIS — G4733 Obstructive sleep apnea (adult) (pediatric): Secondary | ICD-10-CM

## 2024-04-04 MED ORDER — DOXYCYCLINE HYCLATE 100 MG PO TABS: 100.0000 mg | ORAL_TABLET | Freq: Two times a day (BID) | ORAL | 0 refills | Status: AC

## 2024-04-04 NOTE — Progress Notes (Unsigned)
 "  Established Patient Pulmonology Office Visit   Subjective:  Patient ID: Heather Mercado, female    DOB: Sep 15, 1950  MRN: [id number]  CC:  Chief Complaint  Patient presents with   Medical Management of Chronic Issues    Pt states had a head cold over the holidays better now / not using neb treatment     Discussed the use of AI scribe software for clinical note transcription with the patient, who gave verbal consent to proceed.  History of Present Illness Heather Mercado is a 74 year old female with bronchiectasis who presents for follow-up after a CT scan.  She was recently informed that CT imaging showed bronchiectasis. She felt overwhelmed by the diagnosis but reports that a recent viral illness treated with a Z-Pak has resolved, with no current systemic symptoms.  She was prescribed a nebulizer but has used it only once or twice due to its noise and lack of perceived benefit. She prefers her inhaler and reports minimal cough and little mucus.   She saw a rheumatologist who did not find an inflammatory cause for the bronchiectasis. Possible causes, including prior pneumonia, chronic heartburn or reflux, and prior right-sided breast cancer radiation.  She uses Breo and is unsure if it improves cough or breathing. She is worried about pneumonia risk from inhaled steroids in bronchiectasis and is considering stopping Breo to see if symptoms change.  She is planning travel and is anxious about managing bronchiectasis away from home. She uses CPAP at night, which provides moisture, and is considering a travel nebulizer. She is concerned about mucus build-up and chest tightness while traveling and wants a backup plan, including a flutter valve and antibiotics, in case of worsening symptoms.          ROS   Current Medications[1]      Objective:  BP 127/72   Pulse (!) 59   Ht 5' 5 (1.651 m) Comment: per pt  Wt 128 lb 6.4 oz (58.2 kg)   SpO2 99%   BMI 21.37 kg/m      Physical Exam Constitutional:      General: She is not in acute distress.    Appearance: Normal appearance.  Eyes:     General: No scleral icterus.    Conjunctiva/sclera: Conjunctivae normal.  Cardiovascular:     Rate and Rhythm: Normal rate and regular rhythm.  Pulmonary:     Breath sounds: No wheezing, rhonchi or rales.  Musculoskeletal:     Right lower leg: No edema.     Left lower leg: No edema.  Skin:    General: Skin is warm and dry.  Neurological:     General: No focal deficit present.      Diagnostic Review:  Last CBC Lab Results  Component Value Date   WBC 4.4 12/23/2023   HGB 14.2 12/23/2023   HCT 43.0 12/23/2023   MCV 88.1 12/23/2023   MCH 29.5 12/05/2020   RDW 15.0 12/23/2023   PLT 158.0 12/23/2023   Last metabolic panel Lab Results  Component Value Date   GLUCOSE 90 12/23/2023   NA 138 12/23/2023   K 4.2 12/23/2023   CL 104 12/23/2023   CO2 26 12/23/2023   BUN 16 12/23/2023   CREATININE 0.71 12/23/2023   GFR 85.23 12/23/2023   CALCIUM  9.6 12/23/2023   PROT 7.1 12/23/2023   ALBUMIN 4.1 12/23/2023   BILITOT 0.7 12/23/2023   ALKPHOS 50 12/23/2023   AST 23 12/23/2023   ALT 20 12/23/2023  ANIONGAP 10 12/05/2020   CT Chest 02/04/24 1. Bronchiectasis with tree-in-bud nodularity bilaterally, most pronounced in the right middle and upper lobes. Decreased nodularity is noted in the left lower lobe and new nodularity is present in the right upper lobe, suggesting a waxing and waning nodularity related to chronic atypical infection such is mycobacterium avium complex. No follow-up needed if patient is low-risk (and has no known or suspected primary neoplasm). Non-contrast chest CT can be considered in 12 months if patient is high-risk. This recommendation follows the consensus statement: Guidelines for Management of Incidental Pulmonary Nodules Detected on CT Images: From the Fleischner Society 2017; Radiology 2017; 284:228-243. 2. Aortic  atherosclerosis. 3. Coronary artery calcifications.     Assessment & Plan:   Assessment & Plan Bronchiectasis without complication (HCC)  Orders:   doxycycline  (VIBRA -TABS) 100 MG tablet; Take 1 tablet (100 mg total) by mouth 2 (two) times daily. Take in the case of symptoms when traveling abroad   Flutter valve; Future  Chronic cough  Orders:   Flutter valve; Future  OSA (obstructive sleep apnea)      Assessment and Plan Assessment & Plan Bronchiectasis Mild bronchiectasis with airway wall thickening and dilation. No current symptoms. Previous pneumonia and radiation therapy may have contributed. Avoided inhaled steroids due to pneumonia risk. - Discontinued Breo inhaler due to lack of noted benefit and pneumonia risk - Use nebulizer with saline and albuterol  twice daily if symptoms occur. - Ordered flutter valve for mucus clearance during exacerbations. - Prescribed doxycycline  100 mg, 28 tablets, twice daily for 14 days if infection symptoms occur during travel. - Scheduled follow-up in four months to assess condition off Breo.      Return in about 4 months (around 08/02/2024) for f/u visit Dr. Kara.   Dorn KATHEE Kara, MD     [1]  Current Outpatient Medications:    albuterol  (PROVENTIL ) (2.5 MG/3ML) 0.083% nebulizer solution, Take 3 mLs (2.5 mg total) by nebulization in the morning and at bedtime., Disp: 75 mL, Rfl: 5   aspirin  EC 81 MG tablet, Take 1 tablet (81 mg total) by mouth daily. Swallow whole., Disp: , Rfl:    atorvastatin  (LIPITOR) 20 MG tablet, Take 1 tablet (20 mg total) by mouth daily., Disp: 90 tablet, Rfl: 0   benzonatate  (TESSALON ) 200 MG capsule, Take 1 capsule (200 mg total) by mouth 3 (three) times daily as needed for cough., Disp: 90 capsule, Rfl: 1   doxycycline  (VIBRA -TABS) 100 MG tablet, Take 1 tablet (100 mg total) by mouth 2 (two) times daily. Take in the case of symptoms when traveling abroad, Disp: 28 tablet, Rfl: 0   fish oil-omega-3  fatty acids 1000 MG capsule, Take 1 g by mouth daily.  , Disp: , Rfl:    fluticasone  (FLONASE ) 50 MCG/ACT nasal spray, Place 1 spray into both nostrils daily., Disp: 16 g, Rfl: 2   glucosamine-chondroitin 500-400 MG tablet, Take 1 tablet by mouth daily., Disp: , Rfl:    ipratropium (ATROVENT ) 0.03 % nasal spray, Place 2 sprays into both nostrils every 12 (twelve) hours., Disp: 30 mL, Rfl: 12   MAGNESIUM PO, Take by mouth as needed., Disp: , Rfl:    Probiotic Product (PROBIOTIC PO), Take by mouth., Disp: , Rfl:    Pyridoxine HCl (VITAMIN B-6 PO), Take by mouth daily., Disp: , Rfl:    sodium chloride  HYPERTONIC 3 % nebulizer solution, Take by nebulization as needed for other (twice a day as needed for mucus clearance). Use 4 ml  twice daily as needed for mucus clearance., Disp: 350 mL, Rfl: 3   Zinc Oxide-Vitamin C (ZINC PLUS VITAMIN C PO), Take by mouth. W/ vitamin D , Disp: , Rfl:   "

## 2024-04-04 NOTE — Patient Instructions (Addendum)
 Stop breo inhaler and monitor for cough, wheezing, chest tightness of wheezing  Use flutter valve 2-3 times per day, 5-10 good puffs through the device  Use albuterol  nebulizer treatments 2-3 times daily for periods of cough or increased mucous production  Use saline nebulizer treatment if you feel the mucous is difficult to cough up, use 2 times per day  Follow up in 4 months, call sooner if needed

## 2024-04-04 NOTE — Assessment & Plan Note (Signed)
 Heather Mercado

## 2024-04-06 ENCOUNTER — Encounter: Payer: Self-pay | Admitting: Pulmonary Disease
